# Patient Record
Sex: Female | Born: 1955
Health system: Southern US, Community
[De-identification: ages and names within clinical notes are randomized; demographics above are authoritative.]

## PROBLEM LIST (undated history)

## (undated) DIAGNOSIS — R1900 Intra-abdominal and pelvic swelling, mass and lump, unspecified site: Secondary | ICD-10-CM

## (undated) DIAGNOSIS — C801 Malignant (primary) neoplasm, unspecified: Secondary | ICD-10-CM

## (undated) DIAGNOSIS — N952 Postmenopausal atrophic vaginitis: Secondary | ICD-10-CM

## (undated) DIAGNOSIS — R112 Nausea with vomiting, unspecified: Secondary | ICD-10-CM

## (undated) DIAGNOSIS — N950 Postmenopausal bleeding: Secondary | ICD-10-CM

## (undated) DIAGNOSIS — Z800 Family history of malignant neoplasm of digestive organs: Secondary | ICD-10-CM

## (undated) DIAGNOSIS — Z9889 Other specified postprocedural states: Secondary | ICD-10-CM

## (undated) DIAGNOSIS — R928 Other abnormal and inconclusive findings on diagnostic imaging of breast: Secondary | ICD-10-CM

## (undated) DIAGNOSIS — I10 Essential (primary) hypertension: Secondary | ICD-10-CM

## (undated) DIAGNOSIS — R319 Hematuria, unspecified: Secondary | ICD-10-CM

## (undated) HISTORY — DX: Hematuria, unspecified: R31.9

## (undated) HISTORY — DX: Postmenopausal bleeding: N95.0

## (undated) HISTORY — DX: Essential (primary) hypertension: I10

## (undated) HISTORY — DX: Hypercalcemia: E83.52

## (undated) HISTORY — DX: Family history of malignant neoplasm of digestive organs: Z80.0

## (undated) HISTORY — DX: Intra-abdominal and pelvic swelling, mass and lump, unspecified site: R19.00

## (undated) HISTORY — DX: Other abnormal and inconclusive findings on diagnostic imaging of breast: R92.8

---

## 2006-05-20 ENCOUNTER — Ambulatory Visit: Payer: Self-pay | Admitting: Unknown Physician Specialty

## 2008-12-05 DIAGNOSIS — D239 Other benign neoplasm of skin, unspecified: Secondary | ICD-10-CM

## 2008-12-05 HISTORY — DX: Other benign neoplasm of skin, unspecified: D23.9

## 2009-10-07 DIAGNOSIS — C4491 Basal cell carcinoma of skin, unspecified: Secondary | ICD-10-CM

## 2009-10-07 HISTORY — DX: Basal cell carcinoma of skin, unspecified: C44.91

## 2011-06-04 ENCOUNTER — Ambulatory Visit: Payer: Self-pay | Admitting: Nephrology

## 2011-08-05 LAB — HM COLONOSCOPY

## 2013-04-06 LAB — HM PAP SMEAR

## 2014-12-25 ENCOUNTER — Other Ambulatory Visit: Payer: Self-pay | Admitting: Family Medicine

## 2014-12-25 DIAGNOSIS — R928 Other abnormal and inconclusive findings on diagnostic imaging of breast: Secondary | ICD-10-CM

## 2015-01-10 ENCOUNTER — Ambulatory Visit: Admission: RE | Admit: 2015-01-10 | Payer: Self-pay | Source: Ambulatory Visit

## 2015-01-10 ENCOUNTER — Other Ambulatory Visit: Payer: Self-pay | Admitting: Family Medicine

## 2015-01-10 ENCOUNTER — Ambulatory Visit
Admission: RE | Admit: 2015-01-10 | Discharge: 2015-01-10 | Disposition: A | Discharge status: Home / Self Care | Payer: BLUE CROSS/BLUE SHIELD | Source: Ambulatory Visit | Attending: Family Medicine | Admitting: Family Medicine

## 2015-01-10 DIAGNOSIS — R928 Other abnormal and inconclusive findings on diagnostic imaging of breast: Secondary | ICD-10-CM

## 2015-01-10 HISTORY — DX: Malignant (primary) neoplasm, unspecified: C80.1

## 2015-01-10 LAB — HM MAMMOGRAPHY

## 2015-05-09 DIAGNOSIS — I10 Essential (primary) hypertension: Secondary | ICD-10-CM | POA: Insufficient documentation

## 2015-05-09 DIAGNOSIS — R928 Other abnormal and inconclusive findings on diagnostic imaging of breast: Secondary | ICD-10-CM | POA: Insufficient documentation

## 2015-05-09 DIAGNOSIS — Z800 Family history of malignant neoplasm of digestive organs: Secondary | ICD-10-CM | POA: Insufficient documentation

## 2015-05-15 ENCOUNTER — Ambulatory Visit (INDEPENDENT_AMBULATORY_CARE_PROVIDER_SITE_OTHER): Payer: BLUE CROSS/BLUE SHIELD | Admitting: Family Medicine

## 2015-05-15 ENCOUNTER — Encounter: Payer: Self-pay | Admitting: Family Medicine

## 2015-05-15 VITALS — BP 136/70 | HR 88 | Temp 98.0°F | Ht 61.5 in | Wt 122.0 lb

## 2015-05-15 DIAGNOSIS — R319 Hematuria, unspecified: Secondary | ICD-10-CM | POA: Diagnosis not present

## 2015-05-15 DIAGNOSIS — R1900 Intra-abdominal and pelvic swelling, mass and lump, unspecified site: Secondary | ICD-10-CM

## 2015-05-15 DIAGNOSIS — N882 Stricture and stenosis of cervix uteri: Secondary | ICD-10-CM

## 2015-05-15 DIAGNOSIS — N898 Other specified noninflammatory disorders of vagina: Secondary | ICD-10-CM

## 2015-05-15 DIAGNOSIS — N952 Postmenopausal atrophic vaginitis: Secondary | ICD-10-CM | POA: Diagnosis not present

## 2015-05-15 DIAGNOSIS — N950 Postmenopausal bleeding: Secondary | ICD-10-CM | POA: Diagnosis not present

## 2015-05-15 DIAGNOSIS — Z124 Encounter for screening for malignant neoplasm of cervix: Secondary | ICD-10-CM | POA: Diagnosis not present

## 2015-05-15 DIAGNOSIS — Z86010 Personal history of colonic polyps: Secondary | ICD-10-CM

## 2015-05-15 HISTORY — DX: Hematuria, unspecified: R31.9

## 2015-05-15 HISTORY — DX: Postmenopausal bleeding: N95.0

## 2015-05-15 HISTORY — DX: Intra-abdominal and pelvic swelling, mass and lump, unspecified site: R19.00

## 2015-05-15 LAB — WET PREP FOR TRICH, YEAST, CLUE
CLUE CELL EXAM: NEGATIVE
Trichomonas Exam: NEGATIVE
YEAST EXAM: NEGATIVE

## 2015-05-15 LAB — MICROSCOPIC EXAMINATION

## 2015-05-15 NOTE — Assessment & Plan Note (Signed)
Urine today showed trace intact blood on dip, but 0-2 RBCs/hpf on micro; urine culture pending; may have had UTI and her own immune system may have been dealing with this

## 2015-05-15 NOTE — Patient Instructions (Addendum)
Please do see Dr. Vira Agar as soon as possible to get that colonoscopy scheduled; let Dr. Vira Agar know about your symptoms We'll get an ultrasound of your pelvis We're waiting on the urine culture to decide on antibiotics We can start the estrogen cream for atrophic vaginitis after we get the ultrasound back In the meantime, use lubricating jelly Do keep me posted on any symptoms, especially if not resolving

## 2015-05-15 NOTE — Progress Notes (Signed)
BP 136/70 mmHg  Pulse 88  Temp(Src) 98 F (36.7 C)  Ht 5' 1.5" (1.562 m)  Wt 122 lb (55.339 kg)  BMI 22.68 kg/m2  SpO2 100%   Subjective:    Patient ID: Robin Charles, female    DOB: 07/14/1956, 59 y.o.   MRN: [id number]  HPI: Robin Charles is a 59 y.o. female  Chief Complaint  Patient presents with  . vaginal symptoms    patient states that she has been in pain for the last two weeks, she states she had vaginal discharge and bleeding, tender lymph nodes. Also states that she has had rectal bleeding   She got to where it was uncomfortable having intercourse; then the lymph nodes were sore, water was tender on her skin in the shower; lots of soreness down there; exudate; little bit of blood when wiping after urinating; little bit of redness after bowel movements too; that part is getting better; exudate, wearing a pad for a little bit; not like cottage cheese, not like cottage cheese; more like having a period; LMP was about two years or more; no foul odor down below, just smell of blood like a period; no burning with urination, just tenderness; no trouble urinating; always with either constipation or diarrhea, back and forth; due for colonoscopy; last was 3 years ago; had polyps and was supposed to come back for f/u; mother had colon cancer; Dr. Vira Agar; I asked about red flags like night sweats, just a little hot flash but not any sweats; no weight loss  Relevant past medical, surgical, family and social history reviewed and updated as indicated. Interim medical history since our last visit reviewed. Allergies and medications reviewed and updated.  Review of Systems  Constitutional: Negative for fever, chills and unexpected weight change.  Gastrointestinal: Negative for blood in stool (just with wiping).  Hematological: Bruises/bleeds easily (nothing new, nothing major, likely from minor trauma).  Per HPI unless specifically indicated above     Objective:    BP 136/70  mmHg  Pulse 88  Temp(Src) 98 F (36.7 C)  Ht 5' 1.5" (1.562 m)  Wt 122 lb (55.339 kg)  BMI 22.68 kg/m2  SpO2 100%  Wt Readings from Last 3 Encounters:  05/15/15 122 lb (55.339 kg)    Physical Exam  Constitutional: She appears well-developed and well-nourished.  HENT:  Mouth/Throat: Mucous membranes are normal.  Eyes: EOM are normal. No scleral icterus.  Cardiovascular: Normal rate and regular rhythm.   Pulmonary/Chest: Effort normal and breath sounds normal.  Abdominal: Soft. Normal appearance. There is no tenderness. Hernia confirmed negative in the right inguinal area and confirmed negative in the left inguinal area.  Genitourinary: Rectal exam shows no external hemorrhoid, no fissure, no mass, no tenderness and anal tone normal. Pelvic exam was performed with patient supine. There is no rash or lesion on the right labia. There is no rash or lesion on the left labia. Uterus is not deviated, not enlarged and not tender. Cervix exhibits no motion tenderness, no discharge and no friability. Right adnexum displays no mass, no tenderness and no fullness. Left adnexum displays mass and fullness. Left adnexum displays no tenderness. No erythema, tenderness or bleeding in the vagina. No signs of injury around the vagina. No vaginal discharge found.  There is mild erythema of the urethra without discharge; there is discrete fullness/mass on the left, about 2 cm diameter; nontender; os is stenotic  Lymphadenopathy:       Right: No inguinal adenopathy present.  Left: No inguinal adenopathy present.  Psychiatric: She has a normal mood and affect. Her behavior is normal.   Results for orders placed or performed in visit on 05/09/15  HM MAMMOGRAPHY  Result Value Ref Range   HM Mammogram per PP   HM PAP SMEAR  Result Value Ref Range   HM Pap smear per PP   HM COLONOSCOPY  Result Value Ref Range   HM Colonoscopy per PP, repeat in 3 years       Assessment & Plan:   Problem List Items  Addressed This Visit      Genitourinary   Stenotic cervical os    Noted in GYN note previously; explained dx/finding to patient      Relevant Orders   Pap liquid-based and HPV (high risk)   Postmenopausal atrophic vaginitis    Use lubricating jelly; will consider estrogen but after current work-up, needs [place] first        Other   Vaginal discharge    Wet mount was negative today, discussed with patient      Relevant Orders   WET PREP FOR [place], [place], CLUE   History of colon polyps    Patient to contact Dr. Vira Agar and schedule colonoscopy      Postmenopausal bleeding    Never normal, so will get pelvic US; she has seen GYN in the past, refer back after [place]      Relevant Orders   US Pelvis Complete   Pap liquid-based and HPV (high risk)   Pelvic mass in female    She has fullness on the left previously and was sent to GYN; US done at GYN office was negative for mass; will get pelvic US as I feel discrete mass on the left; ddx includes fibroid, ovarian or adnexal mass      Relevant Orders   US Pelvis Complete   Cervical cancer screening    Thin prep collected today; cervical os was stenotic so doubt endocervical cells will be present      Relevant Orders   Pap liquid-based and HPV (high risk)   Hematuria - Primary    Urine today showed trace intact blood on dip, but 0-2 RBCs/hpf on micro; urine culture pending; may have had UTI and her own immune system may have been dealing with this      Relevant Orders   UA/M w/rflx Culture, Routine      Follow up plan: Return if symptoms worsen or fail to improve.  Orders Placed This Encounter  Procedures  . WET PREP FOR [place], [place], CLUE  . US Pelvis Complete  . UA/M w/rflx Culture, Routine

## 2015-05-15 NOTE — Assessment & Plan Note (Signed)
She has fullness on the left previously and was sent to GYN; US done at GYN office was negative for mass; will get pelvic US as I feel discrete mass on the left; ddx includes fibroid, ovarian or adnexal mass

## 2015-05-15 NOTE — Assessment & Plan Note (Signed)
Wet mount was negative today, discussed with patient

## 2015-05-15 NOTE — Assessment & Plan Note (Signed)
Patient to contact Dr. Vira Agar and schedule colonoscopy

## 2015-05-15 NOTE — Assessment & Plan Note (Signed)
Thin prep collected today; cervical os was stenotic so doubt endocervical cells will be present

## 2015-05-15 NOTE — Assessment & Plan Note (Signed)
Never normal, so will get pelvic US; she has seen GYN in the past, refer back after [place]

## 2015-05-15 NOTE — Assessment & Plan Note (Addendum)
Use lubricating jelly; will consider estrogen but after current work-up, needs [place] first

## 2015-05-15 NOTE — Assessment & Plan Note (Signed)
Noted in GYN note previously; explained dx/finding to patient

## 2015-05-17 ENCOUNTER — Telehealth: Payer: Self-pay | Admitting: Family Medicine

## 2015-05-17 LAB — UA/M W/RFLX CULTURE, ROUTINE

## 2015-05-17 NOTE — Telephone Encounter (Signed)
I called and let her know urine culture did not grow out any significant infection

## 2015-05-21 ENCOUNTER — Telehealth: Payer: Self-pay | Admitting: Family Medicine

## 2015-05-21 LAB — PAP LB AND HPV HIGH-RISK: PAP SMEAR COMMENT: 0

## 2015-05-21 NOTE — Telephone Encounter (Signed)
Please let patient know that her pap smear was normal Please check on the status of the ultrasound that was ordered

## 2015-05-22 ENCOUNTER — Other Ambulatory Visit: Payer: Self-pay | Admitting: Family Medicine

## 2015-05-22 DIAGNOSIS — R1900 Intra-abdominal and pelvic swelling, mass and lump, unspecified site: Secondary | ICD-10-CM

## 2015-05-22 DIAGNOSIS — N950 Postmenopausal bleeding: Secondary | ICD-10-CM

## 2015-05-22 NOTE — Telephone Encounter (Signed)
Message left that PAP normal, Robin Charles took care of [place] and patient is aware of appointment

## 2015-05-26 ENCOUNTER — Telehealth: Payer: Self-pay | Admitting: Family Medicine

## 2015-05-26 DIAGNOSIS — R1900 Intra-abdominal and pelvic swelling, mass and lump, unspecified site: Secondary | ICD-10-CM

## 2015-05-26 NOTE — Telephone Encounter (Signed)
I thought I entered this order, first on Sept 8th and then it looks like another ordered on Sept 15th The signature should count; I will re-enter the [place] again; when I did, it told me this was a duplicate order I am out of the office today and will be glad to sign whatever is needed, but don't know why my original orders don't work Please follow-up and let me know if there is something else I have to do to make this [place] happen

## 2015-05-26 NOTE — Assessment & Plan Note (Signed)
Re-enter [place]

## 2015-05-26 NOTE — Telephone Encounter (Signed)
Cassandra from [facility] Radiology scheduling called stated she needs an e-signature on the order for the trans-vag ultra sound. Thanks.

## 2015-05-27 ENCOUNTER — Ambulatory Visit: Admission: RE | Admit: 2015-05-27 | Payer: BLUE CROSS/BLUE SHIELD | Source: Ambulatory Visit

## 2015-05-30 ENCOUNTER — Ambulatory Visit
Admission: RE | Admit: 2015-05-30 | Discharge: 2015-05-30 | Disposition: A | Discharge status: Home / Self Care | Payer: BLUE CROSS/BLUE SHIELD | Source: Ambulatory Visit | Attending: Family Medicine | Admitting: Family Medicine

## 2015-05-30 DIAGNOSIS — N950 Postmenopausal bleeding: Secondary | ICD-10-CM | POA: Insufficient documentation

## 2015-05-30 DIAGNOSIS — R1900 Intra-abdominal and pelvic swelling, mass and lump, unspecified site: Secondary | ICD-10-CM

## 2015-06-04 ENCOUNTER — Telehealth: Payer: Self-pay | Admitting: Family Medicine

## 2015-06-04 NOTE — Telephone Encounter (Signed)
Left message to call.

## 2015-06-04 NOTE — Telephone Encounter (Signed)
Please let patient know that the [place] was unremarkable; it was limited, though, by bowel gas I think we should proceed with having her see GYN (she can call and schedule appt since she is established there, I believe); make sure they have access to my note and labs and [place] She should also see Dr. Vira Agar I want to be thorough and have BOTH GYN and GI see her

## 2015-06-05 NOTE — Telephone Encounter (Signed)
Patient notified, she said she will get those appointments scheduled and knows she needs to get a colonoscopy.

## 2015-09-24 ENCOUNTER — Encounter: Payer: Self-pay | Admitting: *Deleted

## 2015-09-25 ENCOUNTER — Ambulatory Visit
Admission: RE | Admit: 2015-09-25 | Discharge: 2015-09-25 | Disposition: A | Discharge status: Home / Self Care | Payer: BLUE CROSS/BLUE SHIELD | Source: Ambulatory Visit | Attending: Unknown Physician Specialty | Admitting: Unknown Physician Specialty

## 2015-09-25 ENCOUNTER — Encounter
Admission: RE | Disposition: A | Discharge status: Home / Self Care | Payer: Self-pay | Source: Ambulatory Visit | Attending: Unknown Physician Specialty

## 2015-09-25 ENCOUNTER — Ambulatory Visit: Payer: BLUE CROSS/BLUE SHIELD | Admitting: Anesthesiology

## 2015-09-25 DIAGNOSIS — Z800 Family history of malignant neoplasm of digestive organs: Secondary | ICD-10-CM | POA: Insufficient documentation

## 2015-09-25 DIAGNOSIS — K621 Rectal polyp: Secondary | ICD-10-CM | POA: Insufficient documentation

## 2015-09-25 DIAGNOSIS — I10 Essential (primary) hypertension: Secondary | ICD-10-CM | POA: Insufficient documentation

## 2015-09-25 DIAGNOSIS — Z85828 Personal history of other malignant neoplasm of skin: Secondary | ICD-10-CM | POA: Diagnosis not present

## 2015-09-25 DIAGNOSIS — Z79899 Other long term (current) drug therapy: Secondary | ICD-10-CM | POA: Insufficient documentation

## 2015-09-25 DIAGNOSIS — Z7982 Long term (current) use of aspirin: Secondary | ICD-10-CM | POA: Insufficient documentation

## 2015-09-25 DIAGNOSIS — Z86010 Personal history of colonic polyps: Secondary | ICD-10-CM | POA: Diagnosis present

## 2015-09-25 HISTORY — DX: Postmenopausal atrophic vaginitis: N95.2

## 2015-09-25 HISTORY — PX: COLONOSCOPY WITH PROPOFOL: SHX5780

## 2015-09-25 SURGERY — COLONOSCOPY WITH PROPOFOL
Anesthesia: General

## 2015-09-25 MED ORDER — PROPOFOL 500 MG/50ML IV EMUL
INTRAVENOUS | Status: DC | PRN
  Administered 2015-09-25: 120 ug/kg/min via INTRAVENOUS

## 2015-09-25 MED ORDER — MIDAZOLAM HCL 2 MG/2ML IJ SOLN
INTRAMUSCULAR | Status: DC | PRN
  Administered 2015-09-25: 1 mg via INTRAVENOUS

## 2015-09-25 MED ORDER — SODIUM CHLORIDE 0.9 % IV SOLN: INTRAVENOUS | Status: DC

## 2015-09-25 MED ORDER — LIDOCAINE HCL (CARDIAC) 20 MG/ML IV SOLN
INTRAVENOUS | Status: DC | PRN
  Administered 2015-09-25: 60 mg via INTRAVENOUS

## 2015-09-25 MED ORDER — SODIUM CHLORIDE 0.9 % IV SOLN
INTRAVENOUS | Status: DC
  Administered 2015-09-25: 14:00:00 via INTRAVENOUS

## 2015-09-25 MED ORDER — PROPOFOL 10 MG/ML IV BOLUS
INTRAVENOUS | Status: DC | PRN
  Administered 2015-09-25: 30 mg via INTRAVENOUS
  Administered 2015-09-25: 20 mg via INTRAVENOUS

## 2015-09-25 NOTE — H&P (Signed)
   Primary Care Physician:  Enid Derry, MD Primary Gastroenterologist:  Dr. Vira Agar  Pre-Procedure History & Physical: HPI:  Robin Charles is a 60 y.o. female is here for an colonoscopy.   Past Medical History  Diagnosis Date  . Cancer ([place])     skin ca  . Hypertension   . Hypercalcemia   . Family history of colon cancer in mother   . Abnormal mammogram   . Postmenopausal atrophic vaginitis     No past surgical history on file.  Prior to Admission medications   Medication Sig Start Date End Date Taking? Authorizing Provider  aspirin EC 81 MG tablet Take 81 mg by mouth daily.    Historical Provider, MD  cetirizine (ZYRTEC) 10 MG tablet Take 10 mg by mouth daily.    Historical Provider, MD  Multiple Vitamin (MULTIVITAMIN) tablet Take 1 tablet by mouth daily.    Historical Provider, MD    Allergies as of 09/03/2015  . (No Known Allergies)    Family History  Problem Relation Age of Onset  . Colon cancer Mother 70  . Cancer Mother     Social History   Social History  . Marital Status: Married    Spouse Name: N/A  . Number of Children: N/A  . Years of Education: N/A   Occupational History  . Not on file.   Social History Main Topics  . Smoking status: Never Smoker   . Smokeless tobacco: Never Used  . Alcohol Use: No  . Drug Use: No  . Sexual Activity: Yes    Birth Control/ Protection: Post-menopausal   Other Topics Concern  . Not on file   Social History Narrative    Review of Systems: See HPI, otherwise negative ROS  Physical Exam: BP 145/79 mmHg  Pulse 79  Temp(Src) 97 F (36.1 C) (Tympanic)  Ht 5\' 1"  (1.549 m)  Wt 53.071 kg (117 lb)  BMI 22.12 kg/m2  SpO2 100% General:   Alert,  pleasant and cooperative in NAD Head:  Normocephalic and atraumatic. Neck:  Supple; no masses or thyromegaly. Lungs:  Clear throughout to auscultation.    Heart:  Regular rate and rhythm. Abdomen:  Soft, nontender and nondistended. Normal bowel sounds, without  guarding, and without rebound.   Neurologic:  Alert and  oriented x4;  grossly normal neurologically.  Impression/Plan: [name] is here for an colonoscopy to be performed for polyp in distal rectum.  Risks, benefits, limitations, and alternatives regarding  colonoscopy have been reviewed with the patient.  Questions have been answered.  All parties agreeable.   Gaylyn Cheers, MD  09/25/2015, 2:28 PM

## 2015-09-25 NOTE — Anesthesia Postprocedure Evaluation (Signed)
Anesthesia Post Note  Patient: Robin Charles  Procedure(s) Performed: Procedure(s) (LRB): COLONOSCOPY WITH PROPOFOL (N/A)  Patient location during evaluation: Endoscopy Anesthesia Type: General Level of consciousness: awake and alert Pain management: pain level controlled Vital Signs Assessment: post-procedure vital signs reviewed and stable Respiratory status: spontaneous breathing and respiratory function stable Cardiovascular status: stable Anesthetic complications: no    Last Vitals:  Filed Vitals:   09/25/15 1510 09/25/15 1520  BP: 135/82 122/74  Pulse: 76 82  Temp:    Resp: 24 18    Last Pain: There were no vitals filed for this visit.               Adrean Heitz K

## 2015-09-25 NOTE — Anesthesia Preprocedure Evaluation (Addendum)
Anesthesia Evaluation  Patient identified by MRN, date of birth, ID band Patient awake    Reviewed: Allergy & Precautions, NPO status , Patient's Chart, lab work & pertinent test results  History of Anesthesia Complications (+) PONV  Airway Mallampati: II       Dental   Pulmonary neg pulmonary ROS,           Cardiovascular hypertension (OK after d/c bcp),      Neuro/Psych negative neurological ROS     GI/Hepatic negative GI ROS, Neg liver ROS,   Endo/Other  negative endocrine ROS  Renal/GU negative Renal ROS     Musculoskeletal   Abdominal   Peds  Hematology negative hematology ROS (+)   Anesthesia Other Findings   Reproductive/Obstetrics                            Anesthesia Physical Anesthesia Plan  ASA: II  Anesthesia Plan: General   Post-op Pain Management:    Induction: Intravenous  Airway Management Planned: Nasal Cannula  Additional Equipment:   Intra-op Plan:   Post-operative Plan:   Informed Consent: I have reviewed the patients History and Physical, chart, labs and discussed the procedure including the risks, benefits and alternatives for the proposed anesthesia with the patient or authorized representative who has indicated his/her understanding and acceptance.     Plan Discussed with:   Anesthesia Plan Comments:         Anesthesia Quick Evaluation

## 2015-09-25 NOTE — Transfer of Care (Signed)
Immediate Anesthesia Transfer of Care Note  Patient: Robin Charles  Procedure(s) Performed: Procedure(s): COLONOSCOPY WITH PROPOFOL (N/A)  Patient Location: PACU  Anesthesia Type:General  Level of Consciousness: awake and alert   Airway & Oxygen Therapy: Patient Spontanous Breathing and Patient connected to nasal cannula oxygen  Post-op Assessment: Report given to RN and Post -op Vital signs reviewed and stable  Post vital signs: Reviewed and stable  Last Vitals:  Filed Vitals:   09/25/15 1407 09/25/15 1458  BP: 145/79 121/73  Pulse: 79 69  Temp: 36.1 C 36.2 C  Resp:  12    Complications: No apparent anesthesia complications

## 2015-09-25 NOTE — Op Note (Addendum)
[facility] Gastroenterology Patient Name: Robin Charles Procedure Date: 09/25/2015 2:29 PM MRN: [id number] Account #: [id number] Date of Birth: 15-Oct-1955 Admit Type: Outpatient Age: 60 Room: [facility] ENDO ROOM 1 Gender: Female Note Status: Finalized Procedure:         Flexible Sigmoidoscopy Indications:       Follow-up for history of adenomatous polyps in the colon Providers:         Manya Silvas, MD Referring MD:      Arnetha Courser (Referring MD) Medicines:         Propofol per Anesthesia, Flex sig done, not a colonoscopy                     this time. Complications:     No immediate complications. Procedure:         Pre-Anesthesia Assessment:                    - After reviewing the risks and benefits, the patient was                     deemed in satisfactory condition to undergo the procedure.                    After obtaining informed consent, the scope was passed                     under direct vision. The Colonoscope was introduced                     through the anus and advanced to the the rectum. The                     colonoscopy was performed without difficulty. The patient                     tolerated the procedure well. The quality of the bowel                     preparation was excellent. After obtaining informed                     consent, the scope was passed under direct vision. Findings:      A medium-large polyp was found in the rectum. The polyp was sessile. It       was very flat. Narrow band imaging showed it well. Dr. Tamala Julian came to see       the growth. This would be very difficult to remove endoscopicly. It       appears to be in left side of rectum. Impression:        - One large polyp in the rectum.                    - No specimens collected. Recommendation:    - The findings and recommendations were discussed with the                     surgeon. Patient to see Dr. Tamala Julian. Manya Silvas, MD 09/25/2015 3:00:50 PM This  report has been signed electronically. Number of Addenda: 0 Note Initiated On: 09/25/2015 2:29 PM Total Procedure Duration: 0 hours 20 minutes 55 seconds       [facility]

## 2015-09-25 NOTE — Anesthesia Procedure Notes (Signed)
Date/Time: 09/25/2015 2:32 PM Performed by: Johnna Acosta Pre-anesthesia Checklist: Patient identified, Emergency Drugs available, Suction available, Patient being monitored and Timeout performed Patient Re-evaluated:Patient Re-evaluated prior to inductionOxygen Delivery Method: Nasal cannula

## 2015-09-29 ENCOUNTER — Encounter: Payer: Self-pay | Admitting: Unknown Physician Specialty

## 2015-11-05 HISTORY — PX: RECTAL POLYPECTOMY: SHX2309

## 2015-12-09 DIAGNOSIS — D128 Benign neoplasm of rectum: Secondary | ICD-10-CM | POA: Diagnosis not present

## 2015-12-09 DIAGNOSIS — Z86018 Personal history of other benign neoplasm: Secondary | ICD-10-CM | POA: Diagnosis not present

## 2015-12-09 DIAGNOSIS — Z48815 Encounter for surgical aftercare following surgery on the digestive system: Secondary | ICD-10-CM | POA: Diagnosis not present

## 2016-06-14 DIAGNOSIS — Z23 Encounter for immunization: Secondary | ICD-10-CM | POA: Diagnosis not present

## 2016-10-28 ENCOUNTER — Encounter: Payer: BLUE CROSS/BLUE SHIELD | Admitting: Family Medicine

## 2016-11-12 ENCOUNTER — Ambulatory Visit (INDEPENDENT_AMBULATORY_CARE_PROVIDER_SITE_OTHER): Payer: BLUE CROSS/BLUE SHIELD | Admitting: Family Medicine

## 2016-11-12 ENCOUNTER — Encounter: Payer: Self-pay | Admitting: Family Medicine

## 2016-11-12 VITALS — BP 128/72 | HR 61 | Temp 97.8°F | Resp 17 | Ht 62.0 in | Wt 122.0 lb

## 2016-11-12 DIAGNOSIS — Z0000 Encounter for general adult medical examination without abnormal findings: Secondary | ICD-10-CM | POA: Diagnosis not present

## 2016-11-12 DIAGNOSIS — I10 Essential (primary) hypertension: Secondary | ICD-10-CM | POA: Diagnosis not present

## 2016-11-12 DIAGNOSIS — Z114 Encounter for screening for human immunodeficiency virus [HIV]: Secondary | ICD-10-CM | POA: Diagnosis not present

## 2016-11-12 DIAGNOSIS — Z1159 Encounter for screening for other viral diseases: Secondary | ICD-10-CM | POA: Diagnosis not present

## 2016-11-12 DIAGNOSIS — Z1239 Encounter for other screening for malignant neoplasm of breast: Secondary | ICD-10-CM

## 2016-11-12 DIAGNOSIS — Z1231 Encounter for screening mammogram for malignant neoplasm of breast: Secondary | ICD-10-CM

## 2016-11-12 LAB — MICROSCOPIC EXAMINATION
Bacteria, UA: NONE SEEN
RBC MICROSCOPIC, UA: NONE SEEN /HPF (ref 0–?)

## 2016-11-12 LAB — UA/M W/RFLX CULTURE, ROUTINE
BILIRUBIN UA: NEGATIVE
Glucose, UA: NEGATIVE
Ketones, UA: NEGATIVE
Nitrite, UA: NEGATIVE
PH UA: 7 (ref 5.0–7.5)
Protein, UA: NEGATIVE
Specific Gravity, UA: 1.01 (ref 1.005–1.030)
UUROB: 0.2 mg/dL (ref 0.2–1.0)

## 2016-11-12 LAB — MICROALBUMIN, URINE WAIVED
Creatinine, Urine Waived: 50 mg/dL (ref 10–300)
Microalb, Ur Waived: 10 mg/L (ref 0–19)
Microalb/Creat Ratio: <30 mg/g (ref <30)

## 2016-11-12 NOTE — Patient Instructions (Addendum)
Health Maintenance, Female Adopting a healthy lifestyle and getting preventive care can go a long way to promote health and wellness. Talk with your health care provider about what schedule of regular examinations is right for you. This is a good chance for you to check in with your provider about disease prevention and staying healthy. In between checkups, there are plenty of things you can do on your own. Experts have done a lot of research about which lifestyle changes and preventive measures are most likely to keep you healthy. Ask your health care provider for more information. Weight and diet Eat a healthy diet  Be sure to include plenty of vegetables, fruits, low-fat dairy products, and lean protein.  Do not eat a lot of foods high in solid fats, added sugars, or salt.  Get regular exercise. This is one of the most important things you can do for your health.  Most adults should exercise for at least 150 minutes each week. The exercise should increase your heart rate and make you sweat (moderate-intensity exercise).  Most adults should also do strengthening exercises at least twice a week. This is in addition to the moderate-intensity exercise. Maintain a healthy weight  Body mass index (BMI) is a measurement that can be used to identify possible weight problems. It estimates body fat based on height and weight. Your health care provider can help determine your BMI and help you achieve or maintain a healthy weight.  For females 76 years of age and older:  A BMI below 18.5 is considered underweight.  A BMI of 18.5 to 24.9 is normal.  A BMI of 25 to 29.9 is considered overweight.  A BMI of 30 and above is considered obese. Watch levels of cholesterol and blood lipids  You should start having your blood tested for lipids and cholesterol at 60 years of age, then have this test every 5 years.  You may need to have your cholesterol levels checked more often if:  Your lipid or  cholesterol levels are high.  You are older than 61 years of age.  You are at high risk for heart disease. Cancer screening Lung Cancer  Lung cancer screening is recommended for adults 64-42 years old who are at high risk for lung cancer because of a history of smoking.  A yearly low-dose CT scan of the lungs is recommended for people who:  Currently smoke.  Have quit within the past 15 years.  Have at least a 30-pack-year history of smoking. A pack year is smoking an average of one pack of cigarettes a day for 1 year.  Yearly screening should continue until it has been 15 years since you quit.  Yearly screening should stop if you develop a health problem that would prevent you from having lung cancer treatment. Breast Cancer  Practice breast self-awareness. This means understanding how your breasts normally appear and feel.  It also means doing regular breast self-exams. Let your health care provider know about any changes, no matter how small.  If you are in your 20s or 30s, you should have a clinical breast exam (CBE) by a health care provider every 1-3 years as part of a regular health exam.  If you are 34 or older, have a CBE every year. Also consider having a breast X-ray (mammogram) every year.  If you have a family history of breast cancer, talk to your health care provider about genetic screening.  If you are at high risk for breast cancer, talk  to your health care provider about having an MRI and a mammogram every year.  Breast cancer gene (BRCA) assessment is recommended for women who have family members with BRCA-related cancers. BRCA-related cancers include:  Breast.  Ovarian.  Tubal.  Peritoneal cancers.  Results of the assessment will determine the need for genetic counseling and BRCA1 and BRCA2 testing. Cervical Cancer  Your health care provider may recommend that you be screened regularly for cancer of the pelvic organs (ovaries, uterus, and vagina).  This screening involves a pelvic examination, including checking for microscopic changes to the surface of your cervix (Pap test). You may be encouraged to have this screening done every 3 years, beginning at age 24.  For women ages 66-65, health care providers may recommend pelvic exams and Pap testing every 3 years, or they may recommend the Pap and pelvic exam, combined with testing for human papilloma virus (HPV), every 5 years. Some types of HPV increase your risk of cervical cancer. Testing for HPV may also be done on women of any age with unclear Pap test results.  Other health care providers may not recommend any screening for nonpregnant women who are considered low risk for pelvic cancer and who do not have symptoms. Ask your health care provider if a screening pelvic exam is right for you.  If you have had past treatment for cervical cancer or a condition that could lead to cancer, you need Pap tests and screening for cancer for at least 20 years after your treatment. If Pap tests have been discontinued, your risk factors (such as having a new sexual partner) need to be reassessed to determine if screening should resume. Some women have medical problems that increase the chance of getting cervical cancer. In these cases, your health care provider may recommend more frequent screening and Pap tests. Colorectal Cancer  This type of cancer can be detected and often prevented.  Routine colorectal cancer screening usually begins at 61 years of age and continues through 61 years of age.  Your health care provider may recommend screening at an earlier age if you have risk factors for colon cancer.  Your health care provider may also recommend using home test kits to check for hidden blood in the stool.  A small camera at the end of a tube can be used to examine your colon directly (sigmoidoscopy or colonoscopy). This is done to check for the earliest forms of colorectal cancer.  Routine  screening usually begins at age 41.  Direct examination of the colon should be repeated every 5-10 years through 61 years of age. However, you may need to be screened more often if early forms of precancerous polyps or small growths are found. Skin Cancer  Check your skin from head to toe regularly.  Tell your health care provider about any new moles or changes in moles, especially if there is a change in a mole's shape or color.  Also tell your health care provider if you have a mole that is larger than the size of a pencil eraser.  Always use sunscreen. Apply sunscreen liberally and repeatedly throughout the day.  Protect yourself by wearing long sleeves, pants, a wide-brimmed hat, and sunglasses whenever you are outside. Heart disease, diabetes, and high blood pressure  High blood pressure causes heart disease and increases the risk of stroke. High blood pressure is more likely to develop in:  People who have blood pressure in the high end of the normal range (130-139/85-89 mm Hg).  People who are overweight or obese.  People who are African American.  If you are 59-24 years of age, have your blood pressure checked every 3-5 years. If you are 34 years of age or older, have your blood pressure checked every year. You should have your blood pressure measured twice-once when you are at a hospital or clinic, and once when you are not at a hospital or clinic. Record the average of the two measurements. To check your blood pressure when you are not at a hospital or clinic, you can use:  An automated blood pressure machine at a pharmacy.  A home blood pressure monitor.  If you are between 29 years and 60 years old, ask your health care provider if you should take aspirin to prevent strokes.  Have regular diabetes screenings. This involves taking a blood sample to check your fasting blood sugar level.  If you are at a normal weight and have a low risk for diabetes, have this test once  every three years after 61 years of age.  If you are overweight and have a high risk for diabetes, consider being tested at a younger age or more often. Preventing infection Hepatitis B  If you have a higher risk for hepatitis B, you should be screened for this virus. You are considered at high risk for hepatitis B if:  You were born in a country where hepatitis B is common. Ask your health care provider which countries are considered high risk.  Your parents were born in a high-risk country, and you have not been immunized against hepatitis B (hepatitis B vaccine).  You have HIV or AIDS.  You use needles to inject street drugs.  You live with someone who has hepatitis B.  You have had sex with someone who has hepatitis B.  You get hemodialysis treatment.  You take certain medicines for conditions, including cancer, organ transplantation, and autoimmune conditions. Hepatitis C  Blood testing is recommended for:  Everyone born from [birth year] through 1965.  Anyone with known risk factors for hepatitis C. Sexually transmitted infections (STIs)  You should be screened for sexually transmitted infections (STIs) including gonorrhea and chlamydia if:  You are sexually active and are younger than 61 years of age.  You are older than 61 years of age and your health care provider tells you that you are at risk for this type of infection.  Your sexual activity has changed since you were last screened and you are at an increased risk for chlamydia or gonorrhea. Ask your health care provider if you are at risk.  If you do not have HIV, but are at risk, it may be recommended that you take a prescription medicine daily to prevent HIV infection. This is called pre-exposure prophylaxis (PrEP). You are considered at risk if:  You are sexually active and do not regularly use condoms or know the HIV status of your partner(s).  You take drugs by injection.  You are sexually active with a partner  who has HIV. Talk with your health care provider about whether you are at high risk of being infected with HIV. If you choose to begin PrEP, you should first be tested for HIV. You should then be tested every 3 months for as long as you are taking PrEP. Pregnancy  If you are premenopausal and you may become pregnant, ask your health care provider about preconception counseling.  If you may become pregnant, take 400 to 800 micrograms (mcg) of folic acid  every day.  If you want to prevent pregnancy, talk to your health care provider about birth control (contraception). Osteoporosis and menopause  Osteoporosis is a disease in which the bones lose minerals and strength with aging. This can result in serious bone fractures. Your risk for osteoporosis can be identified using a bone density scan.  If you are 63 years of age or older, or if you are at risk for osteoporosis and fractures, ask your health care provider if you should be screened.  Ask your health care provider whether you should take a calcium or vitamin D supplement to lower your risk for osteoporosis.  Menopause may have certain physical symptoms and risks.  Hormone replacement therapy may reduce some of these symptoms and risks. Talk to your health care provider about whether hormone replacement therapy is right for you. Follow these instructions at home:  Schedule regular health, dental, and eye exams.  Stay current with your immunizations.  Do not use any tobacco products including cigarettes, chewing tobacco, or electronic cigarettes.  If you are pregnant, do not drink alcohol.  If you are breastfeeding, limit how much and how often you drink alcohol.  Limit alcohol intake to no more than 1 drink per day for nonpregnant women. One drink equals 12 ounces of beer, 5 ounces of wine, or 1 ounces of hard liquor.  Do not use street drugs.  Do not share needles.  Ask your health care provider for help if you need support  or information about quitting drugs.  Tell your health care provider if you often feel depressed.  Tell your health care provider if you have ever been abused or do not feel safe at home. This information is not intended to replace advice given to you by your health care provider. Make sure you discuss any questions you have with your health care provider. Document Released: 03/08/2011 Document Revised: 01/29/2016 Document Reviewed: 05/27/2015 Elsevier Interactive Patient Education  2017 [place] Maintenance for Postmenopausal Women Menopause is a normal process in which your reproductive ability comes to an end. This process happens gradually over a span of months to years, usually between the ages of 48 and 29. Menopause is complete when you have missed 12 consecutive menstrual periods. It is important to talk with your health care provider about some of the most common conditions that affect postmenopausal women, such as heart disease, cancer, and bone loss (osteoporosis). Adopting a healthy lifestyle and getting preventive care can help to promote your health and wellness. Those actions can also lower your chances of developing some of these common conditions. What should I know about menopause? During menopause, you may experience a number of symptoms, such as:  Moderate-to-severe hot flashes.  Night sweats.  Decrease in sex drive.  Mood swings.  Headaches.  Tiredness.  Irritability.  Memory problems.  Insomnia. Choosing to treat or not to treat menopausal changes is an individual decision that you make with your health care provider. What should I know about hormone replacement therapy and supplements? Hormone therapy products are effective for treating symptoms that are associated with menopause, such as hot flashes and night sweats. Hormone replacement carries certain risks, especially as you become older. If you are thinking about using estrogen or estrogen  with progestin treatments, discuss the benefits and risks with your health care provider. What should I know about heart disease and stroke? Heart disease, heart attack, and stroke become more likely as you age. This may be due, in  part, to the hormonal changes that your body experiences during menopause. These can affect how your body processes dietary fats, triglycerides, and cholesterol. Heart attack and stroke are both medical emergencies. There are many things that you can do to help prevent heart disease and stroke:  Have your blood pressure checked at least every 1-2 years. High blood pressure causes heart disease and increases the risk of stroke.  If you are 54-61 years old, ask your health care provider if you should take aspirin to prevent a heart attack or a stroke.  Do not use any tobacco products, including cigarettes, chewing tobacco, or electronic cigarettes. If you need help quitting, ask your health care provider.  It is important to eat a healthy diet and maintain a healthy weight.  Be sure to include plenty of vegetables, fruits, low-fat dairy products, and lean protein.  Avoid eating foods that are high in solid fats, added sugars, or salt (sodium).  Get regular exercise. This is one of the most important things that you can do for your health.  Try to exercise for at least 150 minutes each week. The type of exercise that you do should increase your heart rate and make you sweat. This is known as moderate-intensity exercise.  Try to do strengthening exercises at least twice each week. Do these in addition to the moderate-intensity exercise.  Know your numbers.Ask your health care provider to check your cholesterol and your blood glucose. Continue to have your blood tested as directed by your health care provider. What should I know about cancer screening? There are several types of cancer. Take the following steps to reduce your risk and to catch any cancer development  as early as possible. Breast Cancer  Practice breast self-awareness.  This means understanding how your breasts normally appear and feel.  It also means doing regular breast self-exams. Let your health care provider know about any changes, no matter how small.  If you are 63 or older, have a clinician do a breast exam (clinical breast exam or CBE) every year. Depending on your age, family history, and medical history, it may be recommended that you also have a yearly breast X-ray (mammogram).  If you have a family history of breast cancer, talk with your health care provider about genetic screening.  If you are at high risk for breast cancer, talk with your health care provider about having an MRI and a mammogram every year.  Breast cancer (BRCA) gene test is recommended for women who have family members with BRCA-related cancers. Results of the assessment will determine the need for genetic counseling and BRCA1 and for BRCA2 testing. BRCA-related cancers include these types:  Breast. This occurs in males or females.  Ovarian.  Tubal. This may also be called fallopian tube cancer.  Cancer of the abdominal or pelvic lining (peritoneal cancer).  Prostate.  Pancreatic. Cervical, Uterine, and Ovarian Cancer  Your health care provider may recommend that you be screened regularly for cancer of the pelvic organs. These include your ovaries, uterus, and vagina. This screening involves a pelvic exam, which includes checking for microscopic changes to the surface of your cervix (Pap test).  For women ages 21-65, health care providers may recommend a pelvic exam and a Pap test every three years. For women ages 61-65, they may recommend the Pap test and pelvic exam, combined with testing for human papilloma virus (HPV), every five years. Some types of HPV increase your risk of cervical cancer. Testing for HPV may  also be done on women of any age who have unclear Pap test results.  Other health  care providers may not recommend any screening for nonpregnant women who are considered low risk for pelvic cancer and have no symptoms. Ask your health care provider if a screening pelvic exam is right for you.  If you have had past treatment for cervical cancer or a condition that could lead to cancer, you need Pap tests and screening for cancer for at least 20 years after your treatment. If Pap tests have been discontinued for you, your risk factors (such as having a new sexual partner) need to be reassessed to determine if you should start having screenings again. Some women have medical problems that increase the chance of getting cervical cancer. In these cases, your health care provider may recommend that you have screening and Pap tests more often.  If you have a family history of uterine cancer or ovarian cancer, talk with your health care provider about genetic screening.  If you have vaginal bleeding after reaching menopause, tell your health care provider.  There are currently no reliable tests available to screen for ovarian cancer. Lung Cancer  Lung cancer screening is recommended for adults 61-47 years old who are at high risk for lung cancer because of a history of smoking. A yearly low-dose CT scan of the lungs is recommended if you:  Currently smoke.  Have a history of at least 30 pack-years of smoking and you currently smoke or have quit within the past 15 years. A pack-year is smoking an average of one pack of cigarettes per day for one year. Yearly screening should:  Continue until it has been 15 years since you quit.  Stop if you develop a health problem that would prevent you from having lung cancer treatment. Colorectal Cancer  This type of cancer can be detected and can often be prevented.  Routine colorectal cancer screening usually begins at age 2 and continues through age 50.  If you have risk factors for colon cancer, your health care provider may recommend  that you be screened at an earlier age.  If you have a family history of colorectal cancer, talk with your health care provider about genetic screening.  Your health care provider may also recommend using home test kits to check for hidden blood in your stool.  A small camera at the end of a tube can be used to examine your colon directly (sigmoidoscopy or colonoscopy). This is done to check for the earliest forms of colorectal cancer.  Direct examination of the colon should be repeated every 5-10 years until age 54. However, if early forms of precancerous polyps or small growths are found or if you have a family history or genetic risk for colorectal cancer, you may need to be screened more often. Skin Cancer  Check your skin from head to toe regularly.  Monitor any moles. Be sure to tell your health care provider:  About any new moles or changes in moles, especially if there is a change in a mole's shape or color.  If you have a mole that is larger than the size of a pencil eraser.  If any of your family members has a history of skin cancer, especially at a young age, talk with your health care provider about genetic screening.  Always use sunscreen. Apply sunscreen liberally and repeatedly throughout the day.  Whenever you are outside, protect yourself by wearing long sleeves, pants, a wide-brimmed hat, and  sunglasses. What should I know about osteoporosis? Osteoporosis is a condition in which bone destruction happens more quickly than new bone creation. After menopause, you may be at an increased risk for osteoporosis. To help prevent osteoporosis or the bone fractures that can happen because of osteoporosis, the following is recommended:  If you are 74-22 years old, get at least 1,000 mg of calcium and at least 600 mg of vitamin D per day.  If you are older than age 43 but younger than age 8, get at least 1,200 mg of calcium and at least 600 mg of vitamin D per day.  If you are  older than age 30, get at least 1,200 mg of calcium and at least 800 mg of vitamin D per day. Smoking and excessive alcohol intake increase the risk of osteoporosis. Eat foods that are rich in calcium and vitamin D, and do weight-bearing exercises several times each week as directed by your health care provider. What should I know about how menopause affects my mental health? Depression may occur at any age, but it is more common as you become older. Common symptoms of depression include:  Low or sad mood.  Changes in sleep patterns.  Changes in appetite or eating patterns.  Feeling an overall lack of motivation or enjoyment of activities that you previously enjoyed.  Frequent crying spells. Talk with your health care provider if you think that you are experiencing depression. What should I know about immunizations? It is important that you get and maintain your immunizations. These include:  Tetanus, diphtheria, and pertussis (Tdap) booster vaccine.  Influenza every year before the flu season begins.  Pneumonia vaccine.  Shingles vaccine. Your health care provider may also recommend other immunizations. This information is not intended to replace advice given to you by your health care provider. Make sure you discuss any questions you have with your health care provider. Document Released: 10/15/2005 Document Revised: 03/12/2016 Document Reviewed: 05/27/2015 Elsevier Interactive Patient Education  2017 [employer] Shingles Vaccine: What You Need to Know 1. What is shingles? Shingles is a painful skin rash, often with blisters. It is also called Herpes Zoster, or just Zoster. A shingles rash usually appears on one side of the face or body and lasts from 2 to 4 weeks. Its main symptom is pain, which can be quite severe. Other symptoms of shingles can include fever, headache, chills and upset stomach. Very rarely, a shingles infection can lead to pneumonia, hearing problems,  blindness, brain inflammation (encephalitis) or death. For about 1 person in 5, severe pain can continue even long after the rash clears up. This is called post-herpetic neuralgia. Shingles is caused by the Varicella Zoster virus, the same virus that causes chickenpox. Only someone who has had chickenpox-or, rarely, has gotten chickenpox vaccine-can get shingles. The virus stays in your body, and can cause shingles many years later. You can't catch shingles from another person with shingles. However, a person who has never had chickenpox (or chicken pox vaccine) could get chickenpox from someone with shingles. This is not very common. Shingles is far more common in people 88 years of age and older than in younger people. It is also more common in people whose immune systems are weakened because of a disease such as cancer, or drugs such as steroids or chemotherapy. At least 1 million people a year in the [place] get shingles. 2. Shingles vaccine A vaccine for shingles was licensed in [address]67. In clinical trials, the vaccine reduced  the risk of shingles by 50%. It can also reduce pain in people who still get shingles after being vaccinated. A single dose of shingles vaccine is recommended for adults 26 years of age and older. 3. Some people should not get shingles vaccine or should wait A person should not get shingles vaccine who:  has ever had a life-threatening allergic reaction to gelatin, the antibiotic neomycin, or any other component of shingles vaccine. Tell your doctor if you have any severe allergies.  has a weakened immune system because of current:  AIDS or another disease that affects the immune system,  treatment with drugs that affect the immune system, such as prolonged use of high-dose steroids,  cancer treatment such as radiation or chemotherapy,  cancer affecting the bone marrow or lymphatic system, such as leukemia or lymphoma.  is pregnant, or might be pregnant. Women  should not become pregnant until at least 4 weeks after getting shingles vaccine. Someone with a minor acute illness, such as a cold, may be vaccinated. But anyone with a moderate or severe acute illness should usually wait until they recover before get ting the vaccine. This includes anyone with a temperature of 101.3 F or higher. 4. What are the risks from shingles vaccine? A vaccine, like any medicine, could possibly cause serious problems, such as severe allergic reactions. However, the risk of a vaccine causing serious harm, or death, is extremely small. No serious problems have been identified with shingles vaccine. Mild problems   Redness, soreness, swelling, or itching at the site of the injection (about 1 person in 3).  Headache (about 1 person in 97). Like all vaccines, shingles vaccine is being closely monitored for unusual or severe problems. 5. What if there is a serious reaction? What should I look for?   Look for anything that concerns you, such as signs of a severe allergic reaction, very high fever, or behavior changes. Signs of a severe allergic reaction can include hives, swelling of the face and throat, difficulty breathing, a fast heartbeat, dizziness, and weakness. These would start a few minutes to a few hours after the vaccination. What should I do?   If you think it is a severe allergic reaction or other emergency that can't wait, call 9-1-1 or get the person to the nearest hospital. Otherwise, call your doctor.  Afterward, the reaction should be reported to the Vaccine Adverse Event Reporting System (VAERS). Your doctor might file this report, or you can do it yourself through the VAERS web site at www.vaers.SamedayNews.es or by calling 539 090 0888. VAERS is only for reporting reactions. They do not give medical advice.  6. How can I learn more?  Ask your doctor.  Call your local or state health department.  Contact the Centers for Disease Control and Prevention  (CDC):  Call (813) 134-0759(1-800-CDC-INFO) or  Visit CDC's website at http://hunter.com/ CDC Vaccine Information Statement (VIS) Shingles Vaccine (06/11/2008) This information is not intended to replace advice given to you by your health care provider. Make sure you discuss any questions you have with your health care provider. Document Released: 06/20/2006 Document Revised: 07/17/2016 Document Reviewed: 07/17/2016 Elsevier Interactive Patient Education  2017 [employer].

## 2016-11-12 NOTE — Assessment & Plan Note (Signed)
Better on recheck. Continue to monitor. Call with any concerns. Recheck 1 year. Checking microalbumin today.

## 2016-11-12 NOTE — Progress Notes (Signed)
BP 128/72   Pulse 61   Temp 97.8 F (36.6 C) (Oral)   Resp 17   Ht 5\' 2"  (1.575 m)   Wt 122 lb (55.3 kg)   SpO2 98%   BMI 22.31 kg/m    Subjective:    Patient ID: Robin Charles, female    DOB: 03-06-1956, 61 y.o.   MRN: [id number]  HPI: Robin Charles is a 61 y.o. female presenting on 11/12/2016 for comprehensive medical examination and to establish care with me as her previous provider left the practice. Current medical complaints include:  Following her last visit- she has not seen GYN, no vaginal bleeding since then. She thinks that this was from her colon issue and not her vagina Had colonoscopy in 2016, had a polyp removed surgically. Due again now with Dr. Tiffany Kocher- he is in contact with her about that.   HYPERTENSION Hypertension status: stable  Satisfied with current treatment? yes Duration of hypertension: chronic BP monitoring frequency:  a few times a month BP range: "Looks good" Previous BP meds: HCTZ Aspirin: yes Recurrent headaches: yes Visual changes: no Palpitations: no Dyspnea: no Chest pain: no Lower extremity edema: no Dizzy/lightheaded: no  She currently lives with: husband Menopausal Symptoms: yes  Depression Screen done today and results listed below:  Depression screen [facility] 2/9 11/12/2016  Decreased Interest 0  Down, Depressed, Hopeless 0  PHQ - 2 Score 0    Past Medical History:  Past Medical History:  Diagnosis Date  . Abnormal mammogram   . Cancer ([place])    skin ca  . Family history of colon cancer in mother   . Hematuria 05/15/2015  . Hypercalcemia   . Hypertension   . Pelvic mass in female 05/15/2015  . Postmenopausal atrophic vaginitis   . Postmenopausal bleeding 05/15/2015    Surgical History:  Past Surgical History:  Procedure Laterality Date  . COLONOSCOPY WITH PROPOFOL N/A 09/25/2015   Procedure: COLONOSCOPY WITH PROPOFOL;  Surgeon: Manya Silvas, MD;  Location: [facility] ENDOSCOPY;  Service: Endoscopy;  Laterality: N/A;  .  RECTAL POLYPECTOMY  11/2015   [facility]    Medications:  Current Outpatient Prescriptions on File Prior to Visit  Medication Sig  . aspirin EC 81 MG tablet Take 81 mg by mouth daily.  . cetirizine (ZYRTEC) 10 MG tablet Take 10 mg by mouth daily.  . Multiple Vitamin (MULTIVITAMIN) tablet Take 1 tablet by mouth daily.   No current facility-administered medications on file prior to visit.     Allergies:  Allergies  Allergen Reactions  . Propoxyphene     Other reaction(s): Vomiting    Social History:  Social History   Social History  . Marital status: Married    Spouse name: N/A  . Number of children: N/A  . Years of education: N/A   Occupational History  . Not on file.   Social History Main Topics  . Smoking status: Never Smoker  . Smokeless tobacco: Never Used  . Alcohol use No  . Drug use: No  . Sexual activity: Yes    Birth control/ protection: Post-menopausal   Other Topics Concern  . Not on file   Social History Narrative  . No narrative on file   History  Smoking Status  . Never Smoker  Smokeless Tobacco  . Never Used   History  Alcohol Use No    Family History:  Family History  Problem Relation Age of Onset  . Colon cancer Mother 79  . Cancer  Mother     Past medical history, surgical history, medications, allergies, family history and social history reviewed with patient today and changes made to appropriate areas of the chart.   Review of Systems  Constitutional: Positive for diaphoresis. Negative for chills, fever, malaise/fatigue and weight loss.  HENT: Positive for nosebleeds. Negative for congestion, ear discharge, ear pain, hearing loss, sinus pain, sore throat and tinnitus.   Eyes: Negative.   Respiratory: Negative.  Negative for stridor.   Cardiovascular: Negative.   Gastrointestinal: Positive for constipation. Negative for abdominal pain, blood in stool, diarrhea, heartburn, melena, nausea and vomiting.  Genitourinary: Negative.     Musculoskeletal: Negative.   Skin: Negative.   Neurological: Positive for tingling. Negative for dizziness, tremors, sensory change, speech change, focal weakness, seizures, loss of consciousness, weakness and headaches.  Endo/Heme/Allergies: Positive for environmental allergies. Negative for polydipsia. Bruises/bleeds easily.  Psychiatric/Behavioral: Negative.     All other ROS negative except what is listed above and in the HPI.      Objective:    BP 128/72   Pulse 61   Temp 97.8 F (36.6 C) (Oral)   Resp 17   Ht 5\' 2"  (1.575 m)   Wt 122 lb (55.3 kg)   SpO2 98%   BMI 22.31 kg/m   Wt Readings from Last 3 Encounters:  11/12/16 122 lb (55.3 kg)  09/25/15 117 lb (53.1 kg)  05/15/15 122 lb (55.3 kg)    Physical Exam  Constitutional: She is oriented to person, place, and time. She appears well-developed and well-nourished. No distress.  HENT:  Head: Normocephalic and atraumatic.  Right Ear: Hearing, tympanic membrane, external ear and ear canal normal.  Left Ear: Hearing, tympanic membrane, external ear and ear canal normal.  Nose: Nose normal.  Mouth/Throat: Uvula is midline, oropharynx is clear and moist and mucous membranes are normal. No oropharyngeal exudate.  Eyes: Conjunctivae, EOM and lids are normal. Pupils are equal, round, and reactive to light. Right eye exhibits no discharge. Left eye exhibits no discharge. No scleral icterus.  Neck: Normal range of motion. Neck supple. No JVD present. No tracheal deviation present. No thyromegaly present.  Cardiovascular: Normal rate, regular rhythm, normal heart sounds and intact distal pulses.  Exam reveals no gallop and no friction rub.   No murmur heard. Pulmonary/Chest: Effort normal and breath sounds normal. No stridor. No respiratory distress. She has no wheezes. She has no rales. She exhibits no tenderness. Right breast exhibits no inverted nipple, no mass, no nipple discharge, no skin change and no tenderness. Left breast  exhibits no inverted nipple, no mass, no nipple discharge, no skin change and no tenderness. Breasts are symmetrical.  Abdominal: Soft. Bowel sounds are normal. She exhibits no distension and no mass. There is no tenderness. There is no rebound and no guarding.  Genitourinary:  Genitourinary Comments: Pelvic exam deferred with shared decision making.   Musculoskeletal: Normal range of motion. She exhibits no edema, tenderness or deformity.  Lymphadenopathy:    She has no cervical adenopathy.  Neurological: She is alert and oriented to person, place, and time. She has normal reflexes. She displays normal reflexes. No cranial nerve deficit. She exhibits normal muscle tone. Coordination normal.  Skin: Skin is warm, dry and intact. No rash noted. She is not diaphoretic. No erythema. No pallor.  Psychiatric: She has a normal mood and affect. Her speech is normal and behavior is normal. Judgment and thought content normal. Cognition and memory are normal.  Nursing note and vitals  reviewed.   Results for orders placed or performed in visit on 05/15/15  WET PREP FOR [place], YEAST, CLUE  Result Value Ref Range   Trichomonas Exam Negative Negative   Yeast Exam Negative Negative   Clue Cell Exam Negative Negative  Microscopic Examination  Result Value Ref Range   WBC, UA 0-5 0 - 5 /hpf   RBC, UA 0-2 0 - 2 /hpf   Epithelial Cells (non renal) 0-10 0 - 10 /hpf   Bacteria, UA Few None seen/Few  UA/M w/rflx Culture, Routine  Result Value Ref Range   Urine Culture, Routine Final report    Urine Culture result 1 Comment   Pap liquid-based and HPV (high risk)  Result Value Ref Range   DIAGNOSIS: Comment    Specimen adequacy: Comment    CLINICIAN PROVIDED ICD10: Comment    Performed by: Comment    QC reviewed by: Comment    PAP SMEAR COMMENT .    Note: Comment       Assessment & Plan:   Problem List Items Addressed This Visit      Cardiovascular and Mediastinum   Hypertension    Better on  recheck. Continue to monitor. Call with any concerns. Recheck 1 year. Checking microalbumin today.      Relevant Orders   Microalbumin, Urine Waived    Other Visit Diagnoses    Routine general medical examination at a health care facility    -  Primary   Vaccines updated. Screening labs checked today. Pap next year. Colonoscopy shortly, Mammogram ordered. Continue diet and exercise.    Relevant Orders   CBC with Differential/Platelet   Comprehensive metabolic panel   Lipid Panel w/o Chol/HDL Ratio   Microalbumin, Urine Waived   TSH   UA/M w/rflx Culture, Routine   Screening for breast cancer       Mammogram ordered today.   Relevant Orders   MM DIGITAL SCREENING BILATERAL   Need for hepatitis C screening test       Labs drawn today. Await results.    Relevant Orders   Hepatitis C Antibody   Screening for HIV without presence of risk factors       Labs drawn today. Await results.    Relevant Orders   HIV antibody       Follow up plan: Return in about 1 year (around 11/12/2017) for Physical.   LABORATORY TESTING:  - Pap smear: up to date- no HPV testing done, will repeat next year  IMMUNIZATIONS:   - Tdap: Tetanus vaccination status reviewed: Thinks she got it at work. - Influenza: Up to date - Pneumovax: Not applicable - Prevnar: Not applicable - Zostavax vaccine: Will consider  SCREENING: -Mammogram: Ordered today  - Colonoscopy: Done elsewhere   PATIENT COUNSELING:   Advised to take 1 mg of folate supplement per day if capable of pregnancy.   Sexuality: Discussed sexually transmitted diseases, partner selection, use of condoms, avoidance of unintended pregnancy  and contraceptive alternatives.   Advised to avoid cigarette smoking.  I discussed with the patient that most people either abstain from alcohol or drink within safe limits (<=14/week and <=4 drinks/occasion for males, <=7/weeks and <= 3 drinks/occasion for females) and that the risk for alcohol  disorders and other health effects rises proportionally with the number of drinks per week and how often a drinker exceeds daily limits.  Discussed cessation/primary prevention of drug use and availability of treatment for abuse.   Diet: Encouraged to adjust caloric intake  to maintain  or achieve ideal body weight, to reduce intake of dietary saturated fat and total fat, to limit sodium intake by avoiding high sodium foods and not adding table salt, and to maintain adequate dietary potassium and calcium preferably from fresh fruits, vegetables, and low-fat dairy products.    stressed the importance of regular exercise  Injury prevention: Discussed safety belts, safety helmets, smoke detector, smoking near bedding or upholstery.   Dental health: Discussed importance of regular tooth brushing, flossing, and dental visits.    NEXT PREVENTATIVE PHYSICAL DUE IN 1 YEAR. Return in about 1 year (around 11/12/2017) for Physical.

## 2016-11-13 LAB — COMPREHENSIVE METABOLIC PANEL
A/G RATIO: 1.7 (ref 1.2–2.2)
ALBUMIN: 4.3 g/dL (ref 3.6–4.8)
ALT: 19 IU/L (ref 0–32)
AST: 19 IU/L (ref 0–40)
Alkaline Phosphatase: 102 IU/L (ref 39–117)
BUN/Creatinine Ratio: 23 (ref 12–28)
BUN: 16 mg/dL (ref 8–27)
Bilirubin Total: 0.4 mg/dL (ref 0.0–1.2)
CALCIUM: 9.6 mg/dL (ref 8.7–10.3)
CO2: 29 mmol/L (ref 18–29)
Chloride: 100 mmol/L (ref 96–106)
Creatinine, Ser: 0.7 mg/dL (ref 0.57–1.00)
GFR, EST AFRICAN AMERICAN: 108 mL/min/{1.73_m2} (ref >59)
GFR, EST NON AFRICAN AMERICAN: 94 mL/min/{1.73_m2} (ref >59)
GLOBULIN, TOTAL: 2.6 g/dL (ref 1.5–4.5)
Glucose: 82 mg/dL (ref 65–99)
POTASSIUM: 4.1 mmol/L (ref 3.5–5.2)
SODIUM: 143 mmol/L (ref 134–144)
TOTAL PROTEIN: 6.9 g/dL (ref 6.0–8.5)

## 2016-11-13 LAB — CBC WITH DIFFERENTIAL/PLATELET
Basophils Absolute: 0 10*3/uL (ref 0.0–0.2)
Basos: 1 %
EOS (ABSOLUTE): 0.1 10*3/uL (ref 0.0–0.4)
EOS: 2 %
HEMATOCRIT: 37.7 % (ref 34.0–46.6)
Hemoglobin: 12.7 g/dL (ref 11.1–15.9)
IMMATURE GRANS (ABS): 0 10*3/uL (ref 0.0–0.1)
IMMATURE GRANULOCYTES: 0 %
Lymphocytes Absolute: 3.1 10*3/uL (ref 0.7–3.1)
Lymphs: 52 %
MCH: 30.5 pg (ref 26.6–33.0)
MCHC: 33.7 g/dL (ref 31.5–35.7)
MCV: 91 fL (ref 79–97)
MONOS ABS: 0.4 10*3/uL (ref 0.1–0.9)
Monocytes: 7 %
NEUTROS PCT: 38 %
Neutrophils Absolute: 2.2 10*3/uL (ref 1.4–7.0)
Platelets: 254 10*3/uL (ref 150–379)
RBC: 4.16 x10E6/uL (ref 3.77–5.28)
RDW: 13.5 % (ref 12.3–15.4)
WBC: 5.9 10*3/uL (ref 3.4–10.8)

## 2016-11-13 LAB — LIPID PANEL W/O CHOL/HDL RATIO
Cholesterol, Total: 181 mg/dL (ref 100–199)
HDL: 78 mg/dL (ref >39)
LDL Calculated: 87 mg/dL (ref 0–99)
Triglycerides: 82 mg/dL (ref 0–149)
VLDL Cholesterol Cal: 16 mg/dL (ref 5–40)

## 2016-11-13 LAB — TSH: TSH: 1.08 u[IU]/mL (ref 0.450–4.500)

## 2016-11-13 LAB — HEPATITIS C ANTIBODY: HEP C VIRUS AB: 0.1 {s_co_ratio} (ref 0.0–0.9)

## 2016-11-13 LAB — HIV ANTIBODY (ROUTINE TESTING W REFLEX): HIV Screen 4th Generation wRfx: NONREACTIVE

## 2016-11-15 ENCOUNTER — Encounter: Payer: Self-pay | Admitting: Family Medicine

## 2016-12-09 ENCOUNTER — Encounter: Payer: Self-pay | Admitting: Family Medicine

## 2016-12-09 ENCOUNTER — Ambulatory Visit
Admission: RE | Admit: 2016-12-09 | Discharge: 2016-12-09 | Disposition: A | Discharge status: Home / Self Care | Payer: BLUE CROSS/BLUE SHIELD | Source: Ambulatory Visit | Attending: Family Medicine | Admitting: Family Medicine

## 2016-12-09 DIAGNOSIS — Z1231 Encounter for screening mammogram for malignant neoplasm of breast: Secondary | ICD-10-CM | POA: Diagnosis not present

## 2016-12-09 DIAGNOSIS — Z1239 Encounter for other screening for malignant neoplasm of breast: Secondary | ICD-10-CM

## 2017-06-14 DIAGNOSIS — Z23 Encounter for immunization: Secondary | ICD-10-CM | POA: Diagnosis not present

## 2017-12-08 ENCOUNTER — Other Ambulatory Visit: Payer: Self-pay

## 2017-12-08 ENCOUNTER — Encounter: Payer: Self-pay | Admitting: Family Medicine

## 2017-12-08 ENCOUNTER — Ambulatory Visit (INDEPENDENT_AMBULATORY_CARE_PROVIDER_SITE_OTHER): Payer: BLUE CROSS/BLUE SHIELD | Admitting: Family Medicine

## 2017-12-08 VITALS — BP 134/74 | HR 62 | Temp 97.7°F | Ht 61.5 in | Wt 122.0 lb

## 2017-12-08 DIAGNOSIS — Z1231 Encounter for screening mammogram for malignant neoplasm of breast: Secondary | ICD-10-CM | POA: Diagnosis not present

## 2017-12-08 DIAGNOSIS — Z0000 Encounter for general adult medical examination without abnormal findings: Secondary | ICD-10-CM

## 2017-12-08 DIAGNOSIS — Z1239 Encounter for other screening for malignant neoplasm of breast: Secondary | ICD-10-CM

## 2017-12-08 DIAGNOSIS — Z23 Encounter for immunization: Secondary | ICD-10-CM | POA: Diagnosis not present

## 2017-12-08 DIAGNOSIS — M7121 Synovial cyst of popliteal space [Baker], right knee: Secondary | ICD-10-CM | POA: Diagnosis not present

## 2017-12-08 DIAGNOSIS — Z124 Encounter for screening for malignant neoplasm of cervix: Secondary | ICD-10-CM

## 2017-12-08 LAB — UA/M W/RFLX CULTURE, ROUTINE
BILIRUBIN UA: NEGATIVE
Glucose, UA: NEGATIVE
KETONES UA: NEGATIVE
Nitrite, UA: NEGATIVE
PROTEIN UA: NEGATIVE
Urobilinogen, Ur: 0.2 mg/dL (ref 0.2–1.0)
pH, UA: 6 (ref 5.0–7.5)

## 2017-12-08 LAB — MICROSCOPIC EXAMINATION: Bacteria, UA: NONE SEEN

## 2017-12-08 LAB — MICROALBUMIN, URINE WAIVED
Creatinine, Urine Waived: 50 mg/dL (ref 10–300)
Microalb, Ur Waived: 10 mg/L (ref 0–19)

## 2017-12-08 NOTE — Patient Instructions (Addendum)
Health Maintenance for Postmenopausal Women Menopause is a normal process in which your reproductive ability comes to an end. This process happens gradually over a span of months to years, usually between the ages of 22 and 9. Menopause is complete when you have missed 12 consecutive menstrual periods. It is important to talk with your health care provider about some of the most common conditions that affect postmenopausal women, such as heart disease, cancer, and bone loss (osteoporosis). Adopting a healthy lifestyle and getting preventive care can help to promote your health and wellness. Those actions can also lower your chances of developing some of these common conditions. What should I know about menopause? During menopause, you may experience a number of symptoms, such as:  Moderate-to-severe hot flashes.  Night sweats.  Decrease in sex drive.  Mood swings.  Headaches.  Tiredness.  Irritability.  Memory problems.  Insomnia.  Choosing to treat or not to treat menopausal changes is an individual decision that you make with your health care provider. What should I know about hormone replacement therapy and supplements? Hormone therapy products are effective for treating symptoms that are associated with menopause, such as hot flashes and night sweats. Hormone replacement carries certain risks, especially as you become older. If you are thinking about using estrogen or estrogen with progestin treatments, discuss the benefits and risks with your health care provider. What should I know about heart disease and stroke? Heart disease, heart attack, and stroke become more likely as you age. This may be due, in part, to the hormonal changes that your body experiences during menopause. These can affect how your body processes dietary fats, triglycerides, and cholesterol. Heart attack and stroke are both medical emergencies. There are many things that you can do to help prevent heart disease  and stroke:  Have your blood pressure checked at least every 1-2 years. High blood pressure causes heart disease and increases the risk of stroke.  If you are 53-22 years old, ask your health care provider if you should take aspirin to prevent a heart attack or a stroke.  Do not use any tobacco products, including cigarettes, chewing tobacco, or electronic cigarettes. If you need help quitting, ask your health care provider.  It is important to eat a healthy diet and maintain a healthy weight. ? Be sure to include plenty of vegetables, fruits, low-fat dairy products, and lean protein. ? Avoid eating foods that are high in solid fats, added sugars, or salt (sodium).  Get regular exercise. This is one of the most important things that you can do for your health. ? Try to exercise for at least 150 minutes each week. The type of exercise that you do should increase your heart rate and make you sweat. This is known as moderate-intensity exercise. ? Try to do strengthening exercises at least twice each week. Do these in addition to the moderate-intensity exercise.  Know your numbers.Ask your health care provider to check your cholesterol and your blood glucose. Continue to have your blood tested as directed by your health care provider.  What should I know about cancer screening? There are several types of cancer. Take the following steps to reduce your risk and to catch any cancer development as early as possible. Breast Cancer  Practice breast self-awareness. ? This means understanding how your breasts normally appear and feel. ? It also means doing regular breast self-exams. Let your health care provider know about any changes, no matter how small.  If you are 40  or older, have a clinician do a breast exam (clinical breast exam or CBE) every year. Depending on your age, family history, and medical history, it may be recommended that you also have a yearly breast X-ray (mammogram).  If you  have a family history of breast cancer, talk with your health care provider about genetic screening.  If you are at high risk for breast cancer, talk with your health care provider about having an MRI and a mammogram every year.  Breast cancer (BRCA) gene test is recommended for women who have family members with BRCA-related cancers. Results of the assessment will determine the need for genetic counseling and BRCA1 and for BRCA2 testing. BRCA-related cancers include these types: ? Breast. This occurs in males or females. ? Ovarian. ? Tubal. This may also be called fallopian tube cancer. ? Cancer of the abdominal or pelvic lining (peritoneal cancer). ? Prostate. ? Pancreatic.  Cervical, Uterine, and Ovarian Cancer Your health care provider may recommend that you be screened regularly for cancer of the pelvic organs. These include your ovaries, uterus, and vagina. This screening involves a pelvic exam, which includes checking for microscopic changes to the surface of your cervix (Pap test).  For women ages 21-65, health care providers may recommend a pelvic exam and a Pap test every three years. For women ages 79-65, they may recommend the Pap test and pelvic exam, combined with testing for human papilloma virus (HPV), every five years. Some types of HPV increase your risk of cervical cancer. Testing for HPV may also be done on women of any age who have unclear Pap test results.  Other health care providers may not recommend any screening for nonpregnant women who are considered low risk for pelvic cancer and have no symptoms. Ask your health care provider if a screening pelvic exam is right for you.  If you have had past treatment for cervical cancer or a condition that could lead to cancer, you need Pap tests and screening for cancer for at least 20 years after your treatment. If Pap tests have been discontinued for you, your risk factors (such as having a new sexual partner) need to be  reassessed to determine if you should start having screenings again. Some women have medical problems that increase the chance of getting cervical cancer. In these cases, your health care provider may recommend that you have screening and Pap tests more often.  If you have a family history of uterine cancer or ovarian cancer, talk with your health care provider about genetic screening.  If you have vaginal bleeding after reaching menopause, tell your health care provider.  There are currently no reliable tests available to screen for ovarian cancer.  Lung Cancer Lung cancer screening is recommended for adults 69-62 years old who are at high risk for lung cancer because of a history of smoking. A yearly low-dose CT scan of the lungs is recommended if you:  Currently smoke.  Have a history of at least 30 pack-years of smoking and you currently smoke or have quit within the past 15 years. A pack-year is smoking an average of one pack of cigarettes per day for one year.  Yearly screening should:  Continue until it has been 15 years since you quit.  Stop if you develop a health problem that would prevent you from having lung cancer treatment.  Colorectal Cancer  This type of cancer can be detected and can often be prevented.  Routine colorectal cancer screening usually begins at  age 56 and continues through age 69.  If you have risk factors for colon cancer, your health care provider may recommend that you be screened at an earlier age.  If you have a family history of colorectal cancer, talk with your health care provider about genetic screening.  Your health care provider may also recommend using home test kits to check for hidden blood in your stool.  A small camera at the end of a tube can be used to examine your colon directly (sigmoidoscopy or colonoscopy). This is done to check for the earliest forms of colorectal cancer.  Direct examination of the colon should be repeated every  5-10 years until age 14. However, if early forms of precancerous polyps or small growths are found or if you have a family history or genetic risk for colorectal cancer, you may need to be screened more often.  Skin Cancer  Check your skin from head to toe regularly.  Monitor any moles. Be sure to tell your health care provider: ? About any new moles or changes in moles, especially if there is a change in a mole's shape or color. ? If you have a mole that is larger than the size of a pencil eraser.  If any of your family members has a history of skin cancer, especially at a young age, talk with your health care provider about genetic screening.  Always use sunscreen. Apply sunscreen liberally and repeatedly throughout the day.  Whenever you are outside, protect yourself by wearing long sleeves, pants, a wide-brimmed hat, and sunglasses.  What should I know about osteoporosis? Osteoporosis is a condition in which bone destruction happens more quickly than new bone creation. After menopause, you may be at an increased risk for osteoporosis. To help prevent osteoporosis or the bone fractures that can happen because of osteoporosis, the following is recommended:  If you are 26-9 years old, get at least 1,000 mg of calcium and at least 600 mg of vitamin D per day.  If you are older than age 88 but younger than age 71, get at least 1,200 mg of calcium and at least 600 mg of vitamin D per day.  If you are older than age 77, get at least 1,200 mg of calcium and at least 800 mg of vitamin D per day.  Smoking and excessive alcohol intake increase the risk of osteoporosis. Eat foods that are rich in calcium and vitamin D, and do weight-bearing exercises several times each week as directed by your health care provider. What should I know about how menopause affects my mental health? Depression may occur at any age, but it is more common as you become older. Common symptoms of depression  include:  Low or sad mood.  Changes in sleep patterns.  Changes in appetite or eating patterns.  Feeling an overall lack of motivation or enjoyment of activities that you previously enjoyed.  Frequent crying spells.  Talk with your health care provider if you think that you are experiencing depression. What should I know about immunizations? It is important that you get and maintain your immunizations. These include:  Tetanus, diphtheria, and pertussis (Tdap) booster vaccine.  Influenza every year before the flu season begins.  Pneumonia vaccine.  Shingles vaccine.  Your health care provider may also recommend other immunizations. This information is not intended to replace advice given to you by your health care provider. Make sure you discuss any questions you have with your health care provider. Document Released: 10/15/2005  Document Revised: 03/12/2016 Document Reviewed: 05/27/2015 Elsevier Interactive Patient Education  2018 [employer]. Tdap Vaccine (Tetanus, Diphtheria and Pertussis): What You Need to Know 1. Why get vaccinated? Tetanus, diphtheria and pertussis are very serious diseases. Tdap vaccine can protect [place] from these diseases. And, Tdap vaccine given to pregnant women can protect newborn babies against pertussis. TETANUS (Lockjaw) is rare in the [place] today. It causes painful muscle tightening and stiffness, usually all over the body.  It can lead to tightening of muscles in the head and neck so you can't open your mouth, swallow, or sometimes even breathe. Tetanus kills about 1 out of 10 people who are infected even after receiving the best medical care.  DIPHTHERIA is also rare in the [place] today. It can cause a thick coating to form in the back of the throat.  It can lead to breathing problems, heart failure, paralysis, and death.  PERTUSSIS (Whooping Cough) causes severe coughing spells, which can cause difficulty breathing, vomiting and  disturbed sleep.  It can also lead to weight loss, incontinence, and rib fractures. Up to 2 in 100 adolescents and 5 in 100 adults with pertussis are hospitalized or have complications, which could include pneumonia or death.  These diseases are caused by bacteria. Diphtheria and pertussis are spread from person to person through secretions from coughing or sneezing. Tetanus enters the body through cuts, scratches, or wounds. Before vaccines, as many as 200,000 cases of diphtheria, 200,000 cases of pertussis, and hundreds of cases of tetanus, were reported in the [place] each year. Since vaccination began, reports of cases for tetanus and diphtheria have dropped by about 99% and for pertussis by about 80%. 2. Tdap vaccine Tdap vaccine can protect adolescents and adults from tetanus, diphtheria, and pertussis. One dose of Tdap is routinely given at age 104 or 90. People who did not get Tdap at that age should get it as soon as possible. Tdap is especially important for healthcare professionals and anyone having close contact with a baby younger than 12 months. Pregnant women should get a dose of Tdap during every pregnancy, to protect the newborn from pertussis. Infants are most at risk for severe, life-threatening complications from pertussis. Another vaccine, called Td, protects against tetanus and diphtheria, but not pertussis. A Td booster should be given every 10 years. Tdap may be given as one of these boosters if you have never gotten Tdap before. Tdap may also be given after a severe cut or burn to prevent tetanus infection. Your doctor or the person giving you the vaccine can give you more information. Tdap may safely be given at the same time as other vaccines. 3. Some people should not get this vaccine  A person who has ever had a life-threatening allergic reaction after a previous dose of any diphtheria, tetanus or pertussis containing vaccine, OR has a severe allergy to any part of  this vaccine, should not get Tdap vaccine. Tell the person giving the vaccine about any severe allergies.  Anyone who had coma or long repeated seizures within 7 days after a childhood dose of DTP or DTaP, or a previous dose of Tdap, should not get Tdap, unless a cause other than the vaccine was found. They can still get Td.  Talk to your doctor if you: ? have seizures or another nervous system problem, ? had severe pain or swelling after any vaccine containing diphtheria, tetanus or pertussis, ? ever had a condition called Guillain-Barr Syndrome (GBS), ? aren't  feeling well on the day the shot is scheduled. 4. Risks With any medicine, including vaccines, there is a chance of side effects. These are usually mild and go away on their own. Serious reactions are also possible but are rare. Most people who get Tdap vaccine do not have any problems with it. Mild problems following Tdap: (Did not interfere with activities)  Pain where the shot was given (about 3 in 4 adolescents or 2 in 3 adults)  Redness or swelling where the shot was given (about 1 person in 5)  Mild fever of at least 100.62F (up to about 1 in 25 adolescents or 1 in 100 adults)  Headache (about 3 or 4 people in 10)  Tiredness (about 1 person in 3 or 4)  Nausea, vomiting, diarrhea, stomach ache (up to 1 in 4 adolescents or 1 in 10 adults)  Chills, sore joints (about 1 person in 10)  Body aches (about 1 person in 3 or 4)  Rash, swollen glands (uncommon)  Moderate problems following Tdap: (Interfered with activities, but did not require medical attention)  Pain where the shot was given (up to 1 in 5 or 6)  Redness or swelling where the shot was given (up to about 1 in 16 adolescents or 1 in 12 adults)  Fever over 102F (about 1 in 100 adolescents or 1 in 250 adults)  Headache (about 1 in 7 adolescents or 1 in 10 adults)  Nausea, vomiting, diarrhea, stomach ache (up to 1 or 3 people in 100)  Swelling of the  entire arm where the shot was given (up to about 1 in 500).  Severe problems following Tdap: (Unable to perform usual activities; required medical attention)  Swelling, severe pain, bleeding and redness in the arm where the shot was given (rare).  Problems that could happen after any vaccine:  People sometimes faint after a medical procedure, including vaccination. Sitting or lying down for about 15 minutes can help prevent fainting, and injuries caused by a fall. Tell your doctor if you feel dizzy, or have vision changes or ringing in the ears.  Some people get severe pain in the shoulder and have difficulty moving the arm where a shot was given. This happens very rarely.  Any medication can cause a severe allergic reaction. Such reactions from a vaccine are very rare, estimated at fewer than 1 in a million doses, and would happen within a few minutes to a few hours after the vaccination. As with any medicine, there is a very remote chance of a vaccine causing a serious injury or death. The safety of vaccines is always being monitored. For more information, visit: http://www.aguilar.org/ 5. What if there is a serious problem? What should I look for? Look for anything that concerns you, such as signs of a severe allergic reaction, very high fever, or unusual behavior. Signs of a severe allergic reaction can include hives, swelling of the face and throat, difficulty breathing, a fast heartbeat, dizziness, and weakness. These would usually start a few minutes to a few hours after the vaccination. What should I do?  If you think it is a severe allergic reaction or other emergency that can't wait, call 9-1-1 or get the person to the nearest hospital. Otherwise, call your doctor.  Afterward, the reaction should be reported to the Vaccine Adverse Event Reporting System (VAERS). Your doctor might file this report, or you can do it yourself through the VAERS web site at www.vaers.SamedayNews.es, or by  calling (269)[phone]. ?  VAERS does not give medical advice. 6. The National Vaccine Injury Compensation Program The [employer] Vaccine Injury Compensation Program (VICP) is a federal program that was created to compensate people who may have been injured by certain vaccines. Persons who believe they may have been injured by a vaccine can learn about the program and about filing a claim by calling 615 727 0302 or visiting the [place] website at GoldCloset.com.ee. There is a time limit to file a claim for compensation. 7. How can I learn more?  Ask your doctor. He or she can give you the vaccine package insert or suggest other sources of information.  Call your local or state health department.  Contact the Centers for Disease Control and Prevention (CDC): ? Call 684-405-7331 (1-800-CDC-INFO) or ? Visit CDC's website at http://hunter.com/ CDC Tdap Vaccine VIS (10/30/13) This information is not intended to replace advice given to you by your health care provider. Make sure you discuss any questions you have with your health care provider. Document Released: 02/22/2012 Document Revised: 05/13/2016 Document Reviewed: 05/13/2016 Elsevier Interactive Patient Education  2017 [employer] Cyst A Baker cyst, also called a popliteal cyst, is a sac-like growth that forms at the back of the knee. The cyst forms when the fluid-filled sac (bursa) that cushions the knee joint becomes enlarged. The bursa that becomes a Baker cyst is located at the back of the knee joint. What are the causes? In most cases, a Baker cyst results from another knee problem that causes swelling inside the knee. This makes the fluid inside the knee joint (synovial fluid) flow into the bursa behind the knee, causing the bursa to enlarge. What increases the risk? You may be more likely to develop a Baker cyst if you already have a knee problem, such as:  A tear in cartilage that cushions the knee joint  (meniscal tear).  A tear in the tissues that connect the bones of the knee joint (ligament tear).  Knee swelling from osteoarthritis, rheumatoid arthritis, or gout.  What are the signs or symptoms? A Baker cyst does not always cause symptoms. A lump behind the knee may be the only sign of the condition. The lump may be painful, especially when the knee is straightened. If the lump is painful, the pain may come and go. The knee may also be stiff. Symptoms may quickly get more severe if the cyst breaks open (ruptures). If your cyst ruptures, signs and symptoms may affect the knee and the back of the lower leg (calf) and may include:  Sudden or worsening pain.  Swelling.  Bruising.  How is this diagnosed? This condition may be diagnosed based on your symptoms and medical history. Your health care provider will also do a physical exam. This may include:  Feeling the cyst to check whether it is tender.  Checking your knee for signs of another knee condition that causes swelling.  You may have imaging tests, such as:  X-rays.  MRI.  Ultrasound.  How is this treated? A Baker cyst that is not painful may go away without treatment. If the cyst gets large or painful, it will likely get better if the underlying knee problem is treated. Treatment for a Baker cyst may include:  Resting.  Keeping weight off of the knee. This means not leaning on the knee to support your body weight.  NSAIDs to reduce pain and swelling.  A procedure to drain the fluid from the cyst with a needle (aspiration). You may also get an  injection of a medicine that reduces swelling (steroid).  Surgery. This may be needed if other treatments do not work. This usually involves correcting knee damage and removing the cyst.  Follow these instructions at home:  Take over-the-counter and prescription medicines only as told by your health care provider.  Rest and return to your normal activities as told by your  health care provider. Avoid activities that make pain or swelling worse. Ask your health care provider what activities are safe for you.  Keep all follow-up visits as told by your health care provider. This is important. Contact a health care provider if:  You have knee pain, stiffness, or swelling that does not get better. Get help right away if:  You have sudden or worsening pain and swelling in your calf area. This information is not intended to replace advice given to you by your health care provider. Make sure you discuss any questions you have with your health care provider. Document Released: 08/23/2005 Document Revised: 05/13/2016 Document Reviewed: 05/13/2016 Elsevier Interactive Patient Education  2018 [employer].

## 2017-12-08 NOTE — Progress Notes (Signed)
BP 134/74 (BP Location: Left Arm, Patient Position: Sitting, Cuff Size: Normal)   Pulse 62   Temp 97.7 F (36.5 C) (Oral)   Ht 5' 1.5" (1.562 m)   Wt 122 lb (55.3 kg)   SpO2 100%   BMI 22.68 kg/m    Subjective:    Patient ID: Robin Charles, female    DOB: Jan 18, 1956, 62 y.o.   MRN: [id number]  HPI: Robin Charles is a 62 y.o. female presenting on 12/08/2017 for comprehensive medical examination. Current medical complaints include:  Has been having some pain in her R knee. She notes that she has been having some pain behind her knee. She notes that it hurts when she lays flat at night. No numbness and tingling except 1x a few nights ago. Moderate in nature. No weakness, no giving out. Worse with squatting. Hasn't tried anything for it.    Overdue for colonoscopy.   She currently lives with: husband Menopausal Symptoms: yes- some hot flashes. Not bothering her much.   Depression Screen done today and results listed below:  Depression screen [facility] 2/9 12/08/2017 11/12/2016  Decreased Interest 0 0  Down, Depressed, Hopeless 0 0  PHQ - 2 Score 0 0    Past Medical History:  Past Medical History:  Diagnosis Date  . Abnormal mammogram   . Cancer ([place])    skin ca  . Family history of colon cancer in mother   . Hematuria 05/15/2015  . Hypercalcemia   . Hypertension   . Pelvic mass in female 05/15/2015  . Postmenopausal atrophic vaginitis   . Postmenopausal bleeding 05/15/2015    Surgical History:  Past Surgical History:  Procedure Laterality Date  . COLONOSCOPY WITH PROPOFOL N/A 09/25/2015   Procedure: COLONOSCOPY WITH PROPOFOL;  Surgeon: Manya Silvas, MD;  Location: [facility] ENDOSCOPY;  Service: Endoscopy;  Laterality: N/A;  . RECTAL POLYPECTOMY  11/2015   [facility]    Medications:  Current Outpatient Medications on File Prior to Visit  Medication Sig  . aspirin EC 81 MG tablet Take 81 mg by mouth daily.  . cetirizine (ZYRTEC) 10 MG tablet Take 10 mg by mouth daily.  Marland Kitchen  ibuprofen (ADVIL,MOTRIN) 200 MG tablet Take by mouth.  . Multiple Vitamin (MULTIVITAMIN) tablet Take 1 tablet by mouth daily.   No current facility-administered medications on file prior to visit.     Allergies:  Allergies  Allergen Reactions  . Propoxyphene     Other reaction(s): Vomiting    Social History:  Social History   Socioeconomic History  . Marital status: Married    Spouse name: Not on file  . Number of children: Not on file  . Years of education: Not on file  . Highest education level: Not on file  Occupational History  . Not on file  Social Needs  . Financial resource strain: Not on file  . Food insecurity:    Worry: Not on file    Inability: Not on file  . Transportation needs:    Medical: Not on file    Non-medical: Not on file  Tobacco Use  . Smoking status: Never Smoker  . Smokeless tobacco: Never Used  Substance and Sexual Activity  . Alcohol use: No  . Drug use: No  . Sexual activity: Yes    Birth control/protection: Post-menopausal  Lifestyle  . Physical activity:    Days per week: Not on file    Minutes per session: Not on file  . Stress: Not on file  Relationships  . Social connections:    Talks on phone: Not on file    Gets together: Not on file    Attends religious service: Not on file    Active member of club or organization: Not on file    Attends meetings of clubs or organizations: Not on file    Relationship status: Not on file  . Intimate partner violence:    Fear of current or ex partner: Not on file    Emotionally abused: Not on file    Physically abused: Not on file    Forced sexual activity: Not on file  Other Topics Concern  . Not on file  Social History Narrative  . Not on file   Social History   Tobacco Use  Smoking Status Never Smoker  Smokeless Tobacco Never Used   Social History   Substance and Sexual Activity  Alcohol Use No    Family History:  Family History  Problem Relation Age of Onset  . Colon  cancer Mother 62  . Cancer Mother     Past medical history, surgical history, medications, allergies, family history and social history reviewed with patient today and changes made to appropriate areas of the chart.   Review of Systems  Constitutional: Negative.   HENT: Positive for tinnitus (mainly at night). Negative for congestion, ear discharge, ear pain, hearing loss, nosebleeds, sinus pain and sore throat.   Eyes: Positive for blurred vision (needs to see eye doctor). Negative for double vision, photophobia, pain, discharge and redness.       Occasional floater  Respiratory: Negative.  Negative for stridor.   Cardiovascular: Negative.   Gastrointestinal: Positive for constipation and diarrhea. Negative for abdominal pain, blood in stool, heartburn, melena, nausea and vomiting.  Genitourinary: Negative.   Musculoskeletal: Positive for joint pain. Negative for back pain, falls, myalgias and neck pain.  Skin: Negative.   Neurological: Negative.   Endo/Heme/Allergies: Positive for environmental allergies. Negative for polydipsia. Does not bruise/bleed easily.  Psychiatric/Behavioral: Negative.     All other ROS negative except what is listed above and in the HPI.      Objective:    BP 134/74 (BP Location: Left Arm, Patient Position: Sitting, Cuff Size: Normal)   Pulse 62   Temp 97.7 F (36.5 C) (Oral)   Ht 5' 1.5" (1.562 m)   Wt 122 lb (55.3 kg)   SpO2 100%   BMI 22.68 kg/m   Wt Readings from Last 3 Encounters:  12/08/17 122 lb (55.3 kg)  11/12/16 122 lb (55.3 kg)  09/25/15 117 lb (53.1 kg)    Physical Exam  Constitutional: She is oriented to person, place, and time. She appears well-developed and well-nourished. No distress.  HENT:  Head: Normocephalic and atraumatic.  Right Ear: Hearing, tympanic membrane, external ear and ear canal normal.  Left Ear: Hearing, tympanic membrane, external ear and ear canal normal.  Nose: Nose normal.  Mouth/Throat: Uvula is midline,  oropharynx is clear and moist and mucous membranes are normal. No oropharyngeal exudate.  Eyes: Pupils are equal, round, and reactive to light. Conjunctivae, EOM and lids are normal. Right eye exhibits no discharge. Left eye exhibits no discharge. No scleral icterus.  Neck: Normal range of motion. Neck supple. No JVD present. No tracheal deviation present. No thyromegaly present.  Cardiovascular: Normal rate, regular rhythm, normal heart sounds and intact distal pulses. Exam reveals no gallop and no friction rub.  No murmur heard. Pulmonary/Chest: Effort normal and breath sounds normal. No stridor.  No respiratory distress. She has no wheezes. She has no rales. She exhibits no tenderness. Right breast exhibits no inverted nipple, no mass, no nipple discharge, no skin change and no tenderness. Left breast exhibits no inverted nipple, no mass, no nipple discharge, no skin change and no tenderness. Breasts are symmetrical.  Abdominal: Soft. Bowel sounds are normal. She exhibits no distension and no mass. There is no tenderness. There is no rebound and no guarding. Hernia confirmed negative in the right inguinal area and confirmed negative in the left inguinal area.  Genitourinary: Vagina normal and uterus normal. Rectal exam shows guaiac negative stool. No labial fusion. There is no rash, tenderness, lesion or injury on the right labia. There is no rash, tenderness, lesion or injury on the left labia. Uterus is not deviated, not enlarged, not fixed and not tender. Cervix exhibits no motion tenderness, no discharge and no friability. Right adnexum displays no mass, no tenderness and no fullness. Left adnexum displays no mass, no tenderness and no fullness. No erythema, tenderness or bleeding in the vagina. No foreign body in the vagina. No signs of injury around the vagina. No vaginal discharge found.  Musculoskeletal: Normal range of motion. She exhibits edema (Baker's cyst behind R knee). She exhibits no  tenderness or deformity.  Lymphadenopathy:    She has no cervical adenopathy.  Neurological: She is alert and oriented to person, place, and time. She has normal reflexes. She displays normal reflexes. No cranial nerve deficit. She exhibits normal muscle tone. Coordination normal.  Skin: Skin is warm, dry and intact. No rash noted. She is not diaphoretic. No erythema. No pallor.  Psychiatric: She has a normal mood and affect. Her speech is normal and behavior is normal. Judgment and thought content normal. Cognition and memory are normal.  Nursing note and vitals reviewed.   Results for orders placed or performed in visit on 11/12/16  Microscopic Examination  Result Value Ref Range   WBC, UA 0-5 0 - 5 /hpf   RBC, UA None seen 0 - 2 /hpf   Epithelial Cells (non renal) 0-10 0 - 10 /hpf   Bacteria, UA None seen None seen/Few  CBC with Differential/Platelet  Result Value Ref Range   WBC 5.9 3.4 - 10.8 x10E3/uL   RBC 4.16 3.77 - 5.28 x10E6/uL   Hemoglobin 12.7 11.1 - 15.9 g/dL   Hematocrit 37.7 34.0 - 46.6 %   MCV 91 79 - 97 fL   MCH 30.5 26.6 - 33.0 pg   MCHC 33.7 31.5 - 35.7 g/dL   RDW 13.5 12.3 - 15.4 %   Platelets 254 150 - 379 x10E3/uL   Neutrophils 38 Not Estab. %   Lymphs 52 Not Estab. %   Monocytes 7 Not Estab. %   Eos 2 Not Estab. %   Basos 1 Not Estab. %   Neutrophils Absolute 2.2 1.4 - 7.0 x10E3/uL   Lymphocytes Absolute 3.1 0.7 - 3.1 x10E3/uL   Monocytes Absolute 0.4 0.1 - 0.9 x10E3/uL   EOS (ABSOLUTE) 0.1 0.0 - 0.4 x10E3/uL   Basophils Absolute 0.0 0.0 - 0.2 x10E3/uL   Immature Granulocytes 0 Not Estab. %   Immature Grans (Abs) 0.0 0.0 - 0.1 x10E3/uL  Comprehensive metabolic panel  Result Value Ref Range   Glucose 82 65 - 99 mg/dL   BUN 16 8 - 27 mg/dL   Creatinine, Ser 0.70 0.57 - 1.00 mg/dL   GFR calc non Af Amer 94 >59 mL/min/1.73   GFR calc Af Wyvonnia Lora  108 >59 mL/min/1.73   BUN/Creatinine Ratio 23 12 - 28   Sodium 143 134 - 144 mmol/L   Potassium 4.1 3.5 - 5.2  mmol/L   Chloride 100 96 - 106 mmol/L   CO2 29 18 - 29 mmol/L   Calcium 9.6 8.7 - 10.3 mg/dL   Total Protein 6.9 6.0 - 8.5 g/dL   Albumin 4.3 3.6 - 4.8 g/dL   Globulin, Total 2.6 1.5 - 4.5 g/dL   Albumin/Globulin Ratio 1.7 1.2 - 2.2   Bilirubin Total 0.4 0.0 - 1.2 mg/dL   Alkaline Phosphatase 102 39 - 117 IU/L   AST 19 0 - 40 IU/L   ALT 19 0 - 32 IU/L  Lipid Panel w/o Chol/HDL Ratio  Result Value Ref Range   Cholesterol, Total 181 100 - 199 mg/dL   Triglycerides 82 0 - 149 mg/dL   HDL 78 >39 mg/dL   VLDL Cholesterol Cal 16 5 - 40 mg/dL   LDL Calculated 87 0 - 99 mg/dL  Microalbumin, Urine Waived  Result Value Ref Range   Microalb, Ur Waived 10 0 - 19 mg/L   Creatinine, Urine Waived 50 10 - 300 mg/dL   Microalb/Creat Ratio <30 <30 mg/g  TSH  Result Value Ref Range   TSH 1.080 0.450 - 4.500 uIU/mL  UA/M w/rflx Culture, Routine  Result Value Ref Range   Specific Gravity, UA 1.010 1.005 - 1.030   pH, UA 7.0 5.0 - 7.5   Color, UA Yellow Yellow   Appearance Ur Clear Clear   Leukocytes, UA Trace (A) Negative   Protein, UA Negative Negative/Trace   Glucose, UA Negative Negative   Ketones, UA Negative Negative   RBC, UA Trace (A) Negative   Bilirubin, UA Negative Negative   Urobilinogen, Ur 0.2 0.2 - 1.0 mg/dL   Nitrite, UA Negative Negative   Microscopic Examination See below:   Hepatitis C Antibody  Result Value Ref Range   Hep C Virus Ab 0.1 0.0 - 0.9 s/co ratio  HIV antibody  Result Value Ref Range   HIV Screen 4th Generation wRfx Non Reactive Non Reactive      Assessment & Plan:   Problem List Items Addressed This Visit    None    Visit Diagnoses    Routine general medical examination at a health care facility    -  Primary   Vaccines updated. Screening labs checked today. Pap done. Colon to be rescheduled. Mammogram ordered. Call with any concerns. Continue diet and exercise.    Relevant Orders   CBC with Differential/Platelet   Comprehensive metabolic panel     UA/M w/rflx Culture, Routine   TSH   Microalbumin, Urine Waived   Lipid Panel w/o Chol/HDL Ratio   Screening for breast cancer       Mammogram ordered today.   Relevant Orders   MM Digital Screening   Screening for cervical cancer       Pap done today.   Relevant Orders   IGP, Aptima HPV, rfx 16/18,45   Need for Tdap vaccination       Tdap given today.   Relevant Orders   Tdap vaccine greater than or equal to 7yo IM (Completed)   Synovial cyst of right popliteal space       Options discussed. Will call with what she'd like to do. Call with any concerns.        Follow up plan: Return in about 1 year (around 12/09/2018) for Physical.   LABORATORY TESTING:  -  Pap smear: pap done  IMMUNIZATIONS:   - Tdap: Tetanus vaccination status reviewed: Tdap vaccination indicated and given today. - Influenza: Up to date - Pneumovax: Not applicable - Prevnar: Not applicable - Zostavax vaccine: Holding off due to insurance  SCREENING: -Mammogram: Ordered today  - Colonoscopy: Ordered today  - Bone Density: Not applicable   PATIENT COUNSELING:   Advised to take 1 mg of folate supplement per day if capable of pregnancy.   Sexuality: Discussed sexually transmitted diseases, partner selection, use of condoms, avoidance of unintended pregnancy  and contraceptive alternatives.   Advised to avoid cigarette smoking.  I discussed with the patient that most people either abstain from alcohol or drink within safe limits (<=14/week and <=4 drinks/occasion for males, <=7/weeks and <= 3 drinks/occasion for females) and that the risk for alcohol disorders and other health effects rises proportionally with the number of drinks per week and how often a drinker exceeds daily limits.  Discussed cessation/primary prevention of drug use and availability of treatment for abuse.   Diet: Encouraged to adjust caloric intake to maintain  or achieve ideal body weight, to reduce intake of dietary saturated fat  and total fat, to limit sodium intake by avoiding high sodium foods and not adding table salt, and to maintain adequate dietary potassium and calcium preferably from fresh fruits, vegetables, and low-fat dairy products.    stressed the importance of regular exercise  Injury prevention: Discussed safety belts, safety helmets, smoke detector, smoking near bedding or upholstery.   Dental health: Discussed importance of regular tooth brushing, flossing, and dental visits.    NEXT PREVENTATIVE PHYSICAL DUE IN 1 YEAR. Return in about 1 year (around 12/09/2018) for Physical.

## 2017-12-09 ENCOUNTER — Encounter: Payer: Self-pay | Admitting: Family Medicine

## 2017-12-09 LAB — CBC WITH DIFFERENTIAL/PLATELET
Basophils Absolute: 0 10*3/uL (ref 0.0–0.2)
Basos: 1 %
EOS (ABSOLUTE): 0.1 10*3/uL (ref 0.0–0.4)
EOS: 2 %
HEMATOCRIT: 37.3 % (ref 34.0–46.6)
HEMOGLOBIN: 12.7 g/dL (ref 11.1–15.9)
Immature Grans (Abs): 0 10*3/uL (ref 0.0–0.1)
Immature Granulocytes: 0 %
LYMPHS ABS: 2.8 10*3/uL (ref 0.7–3.1)
Lymphs: 47 %
MCH: 30.2 pg (ref 26.6–33.0)
MCHC: 34 g/dL (ref 31.5–35.7)
MCV: 89 fL (ref 79–97)
MONOCYTES: 8 %
Monocytes Absolute: 0.5 10*3/uL (ref 0.1–0.9)
Neutrophils Absolute: 2.4 10*3/uL (ref 1.4–7.0)
Neutrophils: 42 %
Platelets: 251 10*3/uL (ref 150–379)
RBC: 4.21 x10E6/uL (ref 3.77–5.28)
RDW: 14.3 % (ref 12.3–15.4)
WBC: 5.8 10*3/uL (ref 3.4–10.8)

## 2017-12-09 LAB — COMPREHENSIVE METABOLIC PANEL
ALT: 16 IU/L (ref 0–32)
AST: 20 IU/L (ref 0–40)
Albumin/Globulin Ratio: 1.9 (ref 1.2–2.2)
Albumin: 4.5 g/dL (ref 3.6–4.8)
Alkaline Phosphatase: 104 IU/L (ref 39–117)
BUN / CREAT RATIO: 19 (ref 12–28)
BUN: 13 mg/dL (ref 8–27)
Bilirubin Total: 0.3 mg/dL (ref 0.0–1.2)
CO2: 29 mmol/L (ref 20–29)
CREATININE: 0.7 mg/dL (ref 0.57–1.00)
Calcium: 10 mg/dL (ref 8.7–10.3)
Chloride: 99 mmol/L (ref 96–106)
GFR calc non Af Amer: 93 mL/min/{1.73_m2} (ref >59)
GFR, EST AFRICAN AMERICAN: 107 mL/min/{1.73_m2} (ref >59)
GLOBULIN, TOTAL: 2.4 g/dL (ref 1.5–4.5)
Glucose: 87 mg/dL (ref 65–99)
Potassium: 4.1 mmol/L (ref 3.5–5.2)
SODIUM: 142 mmol/L (ref 134–144)
Total Protein: 6.9 g/dL (ref 6.0–8.5)

## 2017-12-09 LAB — LIPID PANEL W/O CHOL/HDL RATIO
Cholesterol, Total: 186 mg/dL (ref 100–199)
HDL: 85 mg/dL (ref >39)
LDL Calculated: 91 mg/dL (ref 0–99)
Triglycerides: 49 mg/dL (ref 0–149)
VLDL CHOLESTEROL CAL: 10 mg/dL (ref 5–40)

## 2017-12-09 LAB — TSH: TSH: 1.02 u[IU]/mL (ref 0.450–4.500)

## 2017-12-12 LAB — IGP, APTIMA HPV, RFX 16/18,45
HPV APTIMA: NEGATIVE
PAP SMEAR COMMENT: 0

## 2018-03-19 IMAGING — MG MM DIGITAL SCREENING BILAT W/ TOMO W/ CAD
9 of 14 series · 9 of 30 positions shown · non-contrast
Comparison: Previous exam(s).

CLINICAL DATA: Screening.

EXAM:
2D DIGITAL SCREENING BILATERAL MAMMOGRAM WITH CAD AND ADJUNCT TOMO

[L XCCL]
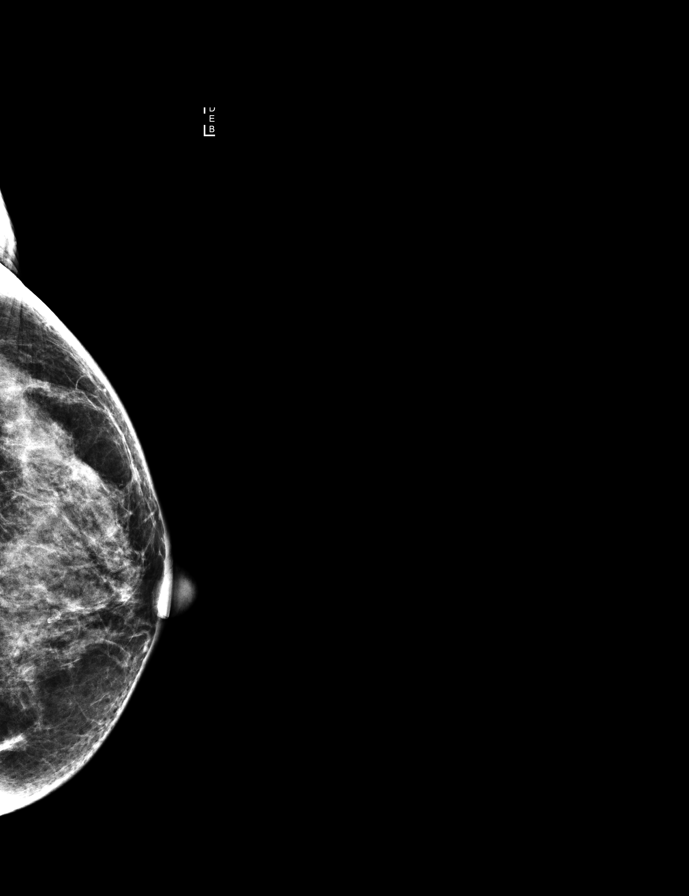

[R XCCL]
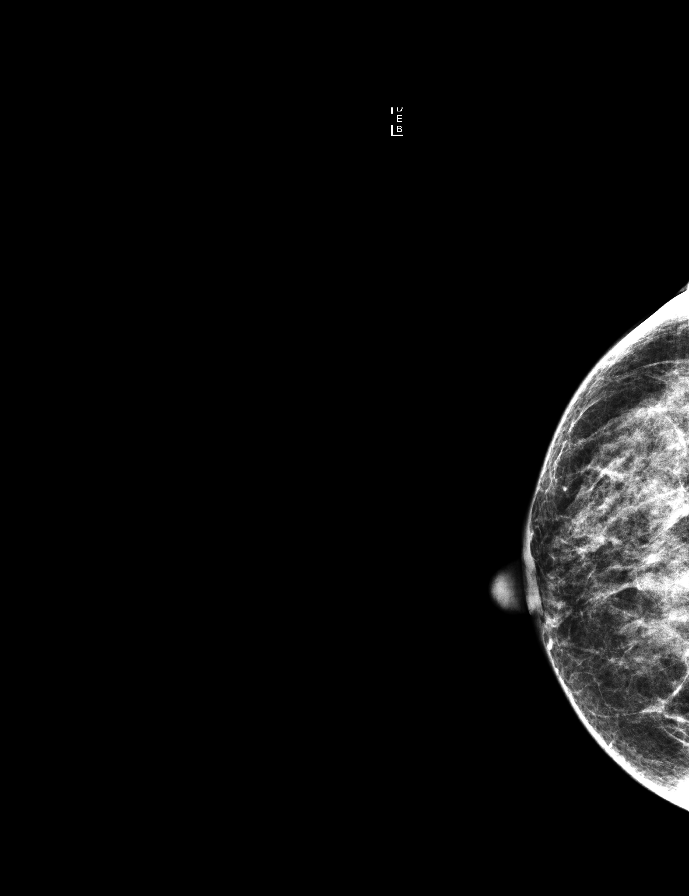

[L MLO]
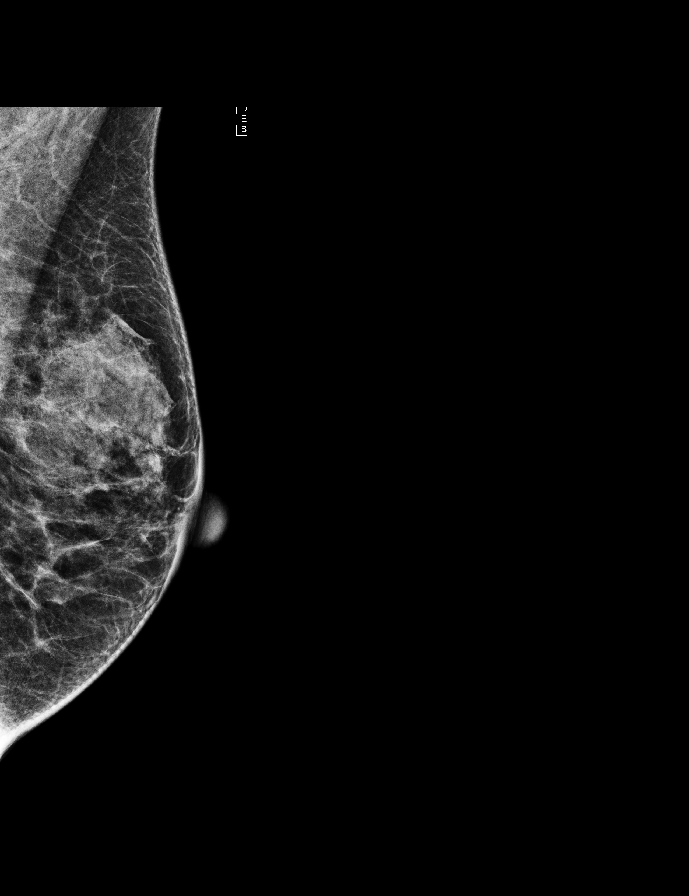

[L CC]
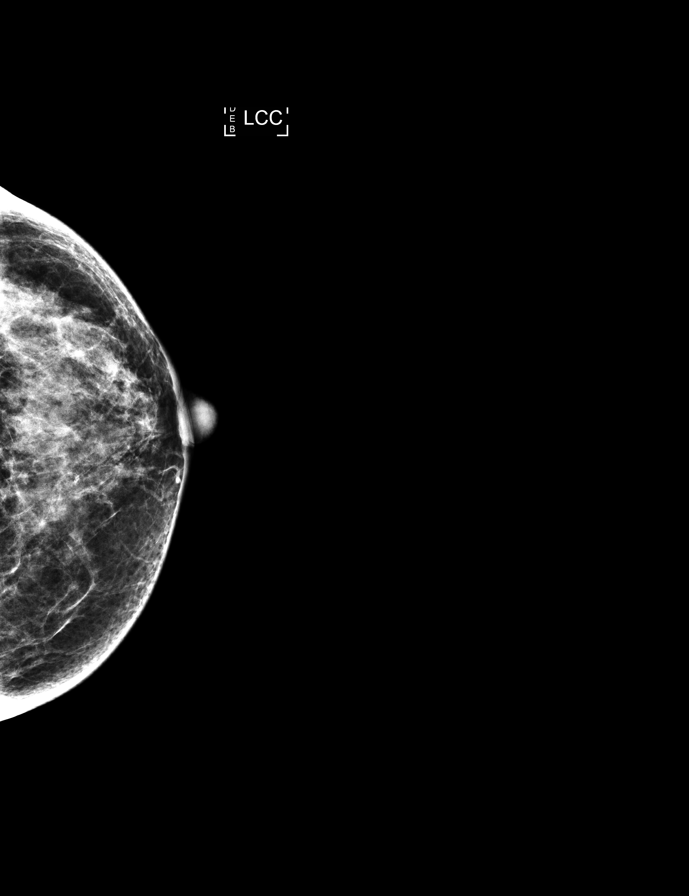

[R CC]
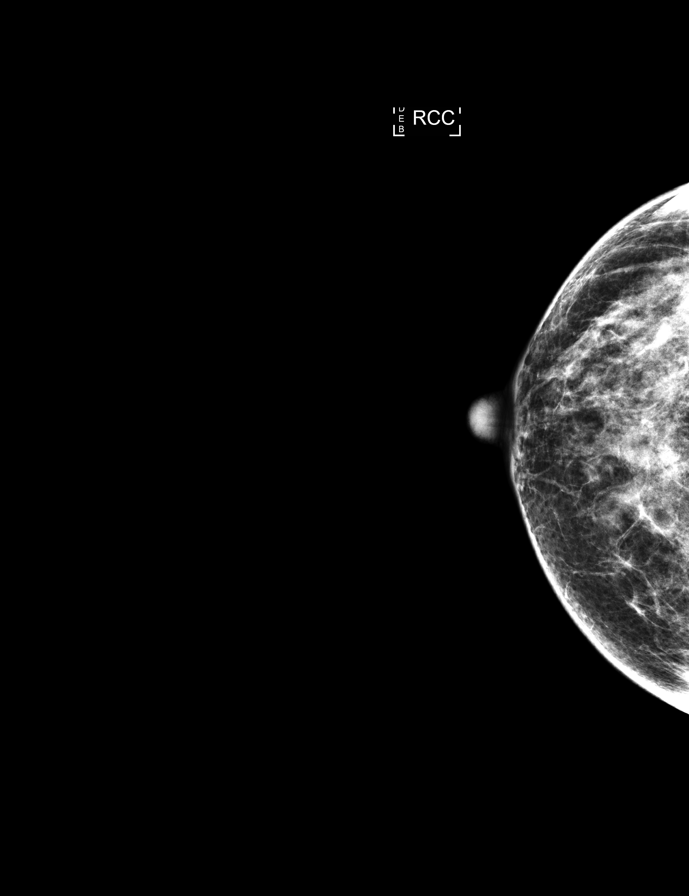

[R CC synth-2D]
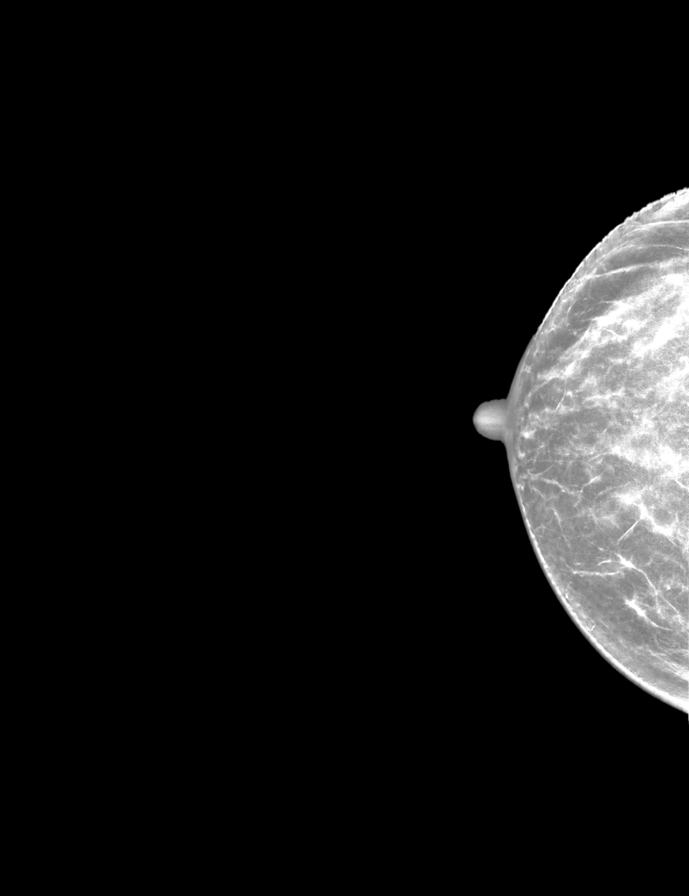

[R MLO synth-2D]
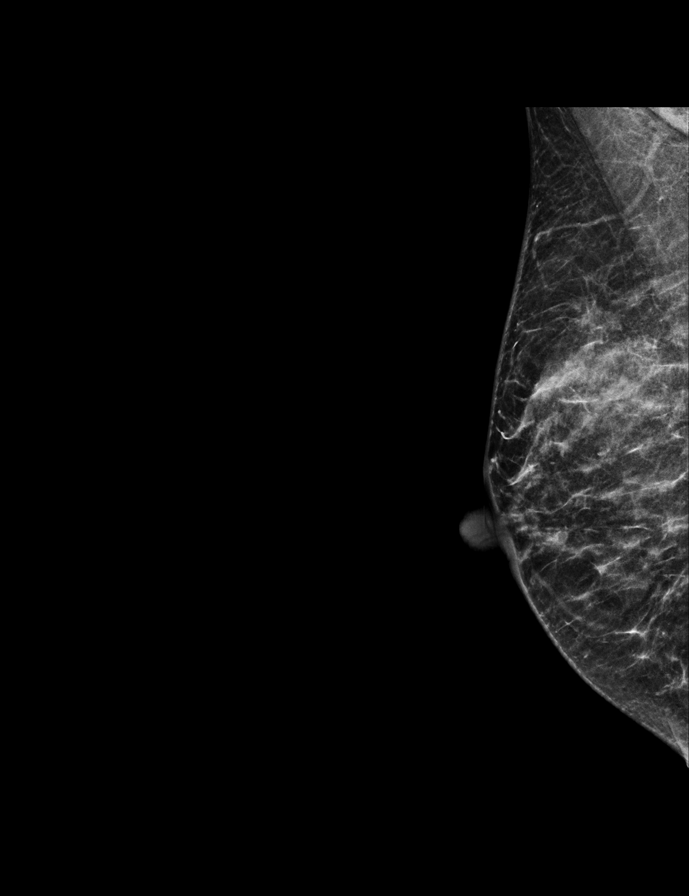

[L MLO synth-2D]
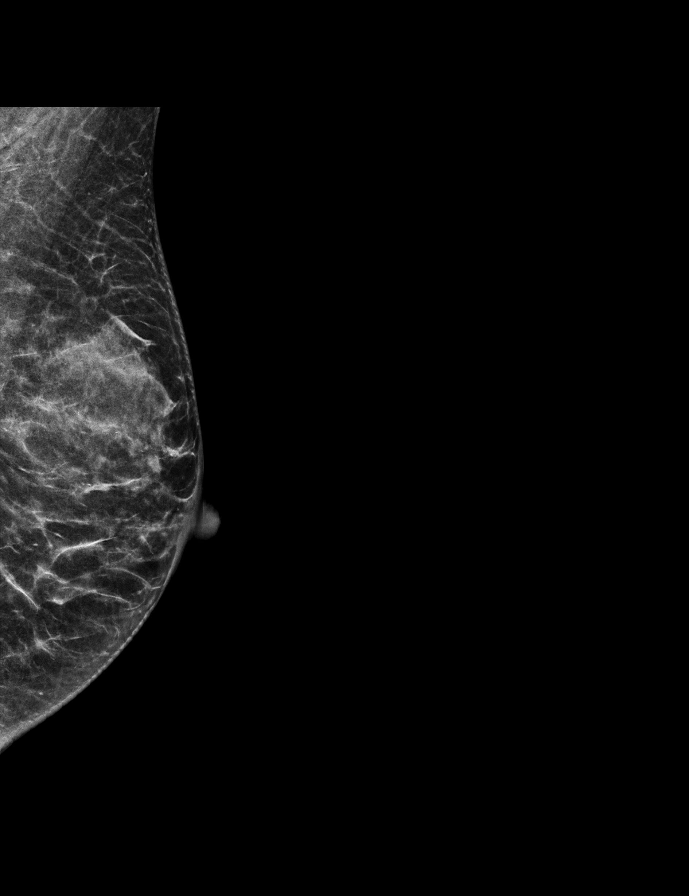

[R MLO]
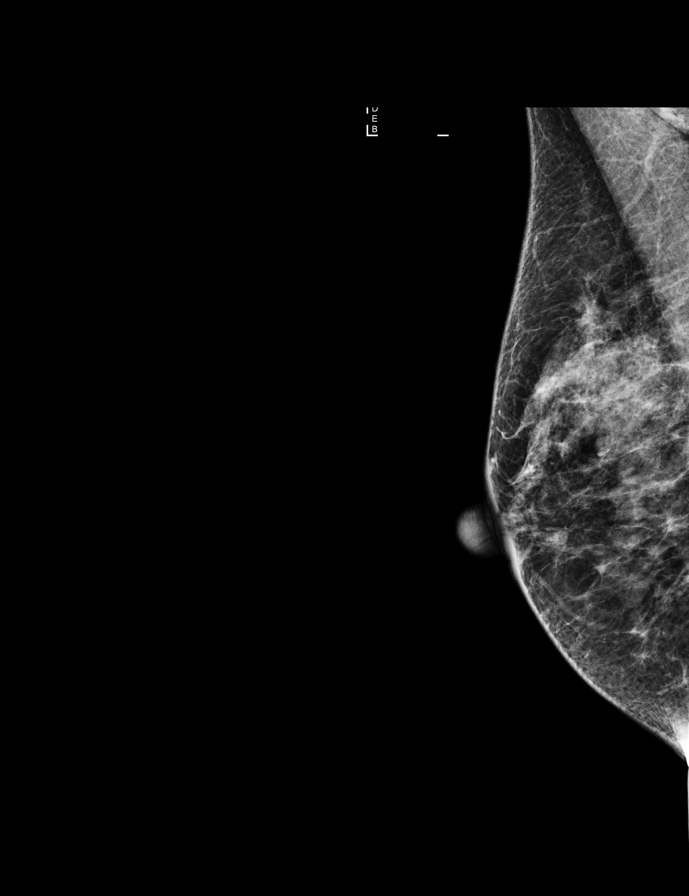

[9 of 30 positions shown; findings below may reference images not displayed]

ACR Breast Density Category c: The breast tissue is heterogeneously
dense, which may obscure small masses.
FINDINGS: There are no findings suspicious for malignancy. Images were
processed with CAD.
IMPRESSION: No mammographic evidence of malignancy. A result letter of this
screening mammogram will be mailed directly to the patient.

RECOMMENDATION:
Screening mammogram in one year. (Code:[id number])

BI-RADS CATEGORY  1: Negative.

## 2018-06-22 DIAGNOSIS — Z23 Encounter for immunization: Secondary | ICD-10-CM | POA: Diagnosis not present

## 2018-09-21 DIAGNOSIS — R1032 Left lower quadrant pain: Secondary | ICD-10-CM | POA: Diagnosis not present

## 2018-09-21 DIAGNOSIS — K625 Hemorrhage of anus and rectum: Secondary | ICD-10-CM | POA: Diagnosis not present

## 2018-09-21 DIAGNOSIS — R197 Diarrhea, unspecified: Secondary | ICD-10-CM | POA: Diagnosis not present

## 2018-09-21 DIAGNOSIS — R1031 Right lower quadrant pain: Secondary | ICD-10-CM | POA: Diagnosis not present

## 2019-06-02 DIAGNOSIS — Z23 Encounter for immunization: Secondary | ICD-10-CM | POA: Diagnosis not present

## 2019-06-04 ENCOUNTER — Telehealth: Payer: Self-pay | Admitting: Family Medicine

## 2019-06-04 DIAGNOSIS — Z1211 Encounter for screening for malignant neoplasm of colon: Secondary | ICD-10-CM

## 2019-06-04 NOTE — Telephone Encounter (Signed)
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 Allen Norris- new referral generated

## 2019-06-06 ENCOUNTER — Telehealth: Payer: Self-pay

## 2019-06-06 ENCOUNTER — Other Ambulatory Visit: Payer: Self-pay

## 2019-06-06 DIAGNOSIS — Z1211 Encounter for screening for malignant neoplasm of colon: Secondary | ICD-10-CM

## 2019-06-06 DIAGNOSIS — Z800 Family history of malignant neoplasm of digestive organs: Secondary | ICD-10-CM

## 2019-06-06 MED ORDER — NA SULFATE-K SULFATE-MG SULF 17.5-3.13-1.6 GM/177ML PO SOLN: 1.0000 | Freq: Once | ORAL | 0 refills | Status: AC

## 2019-06-06 NOTE — Telephone Encounter (Signed)
Gastroenterology Pre-Procedure Review  Request Date: 06/21/19 Requesting Physician: Dr. Allen Norris  PATIENT REVIEW QUESTIONS: The patient responded to the following health history questions as indicated:    1. Are you having any GI issues? no 2. Do you have a personal history of Polyps? yes 3. Do you have a family history of Colon Cancer or Polyps? yes (mother colon cancer) 4. Diabetes Mellitus? no 5. Joint replacements in the past 12 months?no 6. Major health problems in the past 3 months?no 7. Any artificial heart valves, MVP, or defibrillator?no    MEDICATIONS & ALLERGIES:    Patient reports the following regarding taking any anticoagulation/antiplatelet therapy:   Plavix, Coumadin, Eliquis, Xarelto, Lovenox, Pradaxa, Brilinta, or Effient? no Aspirin? yes (81 mg daily)  Patient confirms/reports the following medications:  Current Outpatient Medications  Medication Sig Dispense Refill  . aspirin EC 81 MG tablet Take 81 mg by mouth daily.    . cetirizine (ZYRTEC) 10 MG tablet Take 10 mg by mouth daily.    Marland Kitchen ibuprofen (ADVIL,MOTRIN) 200 MG tablet Take by mouth.    . Multiple Vitamin (MULTIVITAMIN) tablet Take 1 tablet by mouth daily.     No current facility-administered medications for this visit.     Patient confirms/reports the following allergies:  Allergies  Allergen Reactions  . Propoxyphene     Other reaction(s): Vomiting    No orders of the defined types were placed in this encounter.   AUTHORIZATION INFORMATION Primary Insurance: 1D#: Group #:  Secondary Insurance: 1D#: Group #:  SCHEDULE INFORMATION: Date: 06/21/19 Time: Location:[facility]

## 2019-06-14 ENCOUNTER — Encounter: Payer: Self-pay | Admitting: *Deleted

## 2019-06-14 ENCOUNTER — Other Ambulatory Visit: Payer: Self-pay

## 2019-06-14 NOTE — Anesthesia Preprocedure Evaluation (Addendum)
Anesthesia Evaluation  Patient identified by MRN, date of birth, ID band Patient awake    Reviewed: Allergy & Precautions, NPO status , Patient's Chart, lab work & pertinent test results  History of Anesthesia Complications (+) PONVNegative for: history of anesthetic complications  Airway Mallampati: II  TM Distance: >3 FB Neck ROM: Full    Dental   Pulmonary    breath sounds clear to auscultation       Cardiovascular hypertension, (-) angina(-) DOE  Rhythm:Regular Rate:Normal     Neuro/Psych    GI/Hepatic neg GERD  ,  Endo/Other   Hypercalcemia  Renal/GU      Musculoskeletal   Abdominal   Peds  Hematology   Anesthesia Other Findings Skin cancer  Reproductive/Obstetrics                          Anesthesia Physical Anesthesia Plan  ASA: I  Anesthesia Plan: General   Post-op Pain Management:    Induction: Intravenous  PONV Risk Score and Plan: 3 and Propofol infusion, TIVA and Treatment may vary due to age or medical condition  Airway Management Planned: Natural Airway and Nasal Cannula  Additional Equipment:   Intra-op Plan:   Post-operative Plan:   Informed Consent: I have reviewed the patients History and Physical, chart, labs and discussed the procedure including the risks, benefits and alternatives for the proposed anesthesia with the patient or authorized representative who has indicated his/her understanding and acceptance.       Plan Discussed with: CRNA and Anesthesiologist  Anesthesia Plan Comments:        Anesthesia Quick Evaluation

## 2019-06-15 NOTE — Discharge Instructions (Signed)

## 2019-06-18 ENCOUNTER — Other Ambulatory Visit: Payer: Self-pay

## 2019-06-18 ENCOUNTER — Other Ambulatory Visit
Admission: RE | Admit: 2019-06-18 | Discharge: 2019-06-18 | Disposition: A | Discharge status: Home / Self Care | Payer: BC Managed Care – PPO | Source: Ambulatory Visit | Attending: Gastroenterology | Admitting: Gastroenterology

## 2019-06-18 DIAGNOSIS — Z01812 Encounter for preprocedural laboratory examination: Secondary | ICD-10-CM | POA: Diagnosis not present

## 2019-06-18 DIAGNOSIS — Z20828 Contact with and (suspected) exposure to other viral communicable diseases: Secondary | ICD-10-CM | POA: Insufficient documentation

## 2019-06-18 LAB — SARS CORONAVIRUS 2 (TAT 6-24 HRS): SARS Coronavirus 2: NEGATIVE

## 2019-06-19 ENCOUNTER — Encounter: Payer: BLUE CROSS/BLUE SHIELD | Admitting: Family Medicine

## 2019-06-21 ENCOUNTER — Ambulatory Visit: Payer: BC Managed Care – PPO | Admitting: Anesthesiology

## 2019-06-21 ENCOUNTER — Ambulatory Visit
Admission: RE | Admit: 2019-06-21 | Discharge: 2019-06-21 | Disposition: A | Discharge status: Home / Self Care | Payer: BC Managed Care – PPO | Attending: Gastroenterology | Admitting: Gastroenterology

## 2019-06-21 ENCOUNTER — Other Ambulatory Visit: Payer: Self-pay

## 2019-06-21 ENCOUNTER — Encounter
Admission: RE | Disposition: A | Discharge status: Home / Self Care | Payer: Self-pay | Source: Home / Self Care | Attending: Gastroenterology

## 2019-06-21 DIAGNOSIS — Z1211 Encounter for screening for malignant neoplasm of colon: Secondary | ICD-10-CM | POA: Diagnosis not present

## 2019-06-21 DIAGNOSIS — Z86010 Personal history of colonic polyps: Secondary | ICD-10-CM | POA: Insufficient documentation

## 2019-06-21 DIAGNOSIS — K6289 Other specified diseases of anus and rectum: Secondary | ICD-10-CM | POA: Insufficient documentation

## 2019-06-21 DIAGNOSIS — Z79899 Other long term (current) drug therapy: Secondary | ICD-10-CM | POA: Insufficient documentation

## 2019-06-21 DIAGNOSIS — Z791 Long term (current) use of non-steroidal anti-inflammatories (NSAID): Secondary | ICD-10-CM | POA: Insufficient documentation

## 2019-06-21 DIAGNOSIS — K635 Polyp of colon: Secondary | ICD-10-CM

## 2019-06-21 DIAGNOSIS — Z800 Family history of malignant neoplasm of digestive organs: Secondary | ICD-10-CM

## 2019-06-21 DIAGNOSIS — Z7982 Long term (current) use of aspirin: Secondary | ICD-10-CM | POA: Diagnosis not present

## 2019-06-21 DIAGNOSIS — Z809 Family history of malignant neoplasm, unspecified: Secondary | ICD-10-CM | POA: Insufficient documentation

## 2019-06-21 DIAGNOSIS — D122 Benign neoplasm of ascending colon: Secondary | ICD-10-CM | POA: Insufficient documentation

## 2019-06-21 DIAGNOSIS — D123 Benign neoplasm of transverse colon: Secondary | ICD-10-CM | POA: Insufficient documentation

## 2019-06-21 DIAGNOSIS — K621 Rectal polyp: Secondary | ICD-10-CM | POA: Diagnosis not present

## 2019-06-21 DIAGNOSIS — Z888 Allergy status to other drugs, medicaments and biological substances status: Secondary | ICD-10-CM | POA: Insufficient documentation

## 2019-06-21 DIAGNOSIS — Z85828 Personal history of other malignant neoplasm of skin: Secondary | ICD-10-CM | POA: Insufficient documentation

## 2019-06-21 HISTORY — PX: POLYPECTOMY: SHX5525

## 2019-06-21 HISTORY — DX: Nausea with vomiting, unspecified: R11.2

## 2019-06-21 HISTORY — PX: COLONOSCOPY WITH PROPOFOL: SHX5780

## 2019-06-21 HISTORY — DX: Other specified postprocedural states: Z98.890

## 2019-06-21 SURGERY — COLONOSCOPY WITH PROPOFOL
Anesthesia: General | Site: Rectum

## 2019-06-21 MED ORDER — STERILE WATER FOR IRRIGATION IR SOLN
Status: DC | PRN
  Administered 2019-06-21: 50 mL

## 2019-06-21 MED ORDER — LIDOCAINE HCL (CARDIAC) PF 100 MG/5ML IV SOSY
PREFILLED_SYRINGE | INTRAVENOUS | Status: DC | PRN
  Administered 2019-06-21: 50 mg via INTRAVENOUS

## 2019-06-21 MED ORDER — PROPOFOL 10 MG/ML IV BOLUS
INTRAVENOUS | Status: DC | PRN
  Administered 2019-06-21: 50 mg via INTRAVENOUS
  Administered 2019-06-21: 30 mg via INTRAVENOUS
  Administered 2019-06-21: 50 mg via INTRAVENOUS
  Administered 2019-06-21: 100 mg via INTRAVENOUS
  Administered 2019-06-21: 40 mg via INTRAVENOUS
  Administered 2019-06-21: 30 mg via INTRAVENOUS

## 2019-06-21 MED ORDER — LACTATED RINGERS IV SOLN
100.0000 mL/h | INTRAVENOUS | Status: DC
  Administered 2019-06-21: 100 mL/h via INTRAVENOUS

## 2019-06-21 MED ORDER — ONDANSETRON HCL 4 MG/2ML IJ SOLN: 4.0000 mg | Freq: Once | INTRAMUSCULAR | Status: DC | PRN

## 2019-06-21 MED ORDER — ACETAMINOPHEN 10 MG/ML IV SOLN: 1000.0000 mg | Freq: Once | INTRAVENOUS | Status: DC | PRN

## 2019-06-21 MED ORDER — SODIUM CHLORIDE 0.9 % IV SOLN: INTRAVENOUS | Status: DC

## 2019-06-21 SURGICAL SUPPLY — 6 items
CANISTER SUCT 1200ML W/VALVE (MISCELLANEOUS) ×2 IMPLANT
FORCEPS BIOP RAD 4 LRG CAP 4 (CUTTING FORCEPS) ×2 IMPLANT
GOWN CVR UNV OPN BCK APRN NK (MISCELLANEOUS) ×2 IMPLANT
GOWN ISOL THUMB LOOP REG UNIV (MISCELLANEOUS) ×2
KIT ENDO PROCEDURE OLY (KITS) ×2 IMPLANT
WATER STERILE IRR 250ML POUR (IV SOLUTION) ×2 IMPLANT

## 2019-06-21 NOTE — Transfer of Care (Signed)
Immediate Anesthesia Transfer of Care Note  Patient: Robin Charles  Procedure(s) Performed: COLONOSCOPY WITH BIOPSY (N/A Rectum) POLYPECTOMY (N/A Rectum)  Patient Location: PACU  Anesthesia Type: General  Level of Consciousness: awake, alert  and patient cooperative  Airway and Oxygen Therapy: Patient Spontanous Breathing and Patient connected to supplemental oxygen  Post-op Assessment: Post-op Vital signs reviewed, Patient's Cardiovascular Status Stable, Respiratory Function Stable, Patent Airway and No signs of Nausea or vomiting  Post-op Vital Signs: Reviewed and stable  Complications: No apparent anesthesia complications

## 2019-06-21 NOTE — Anesthesia Procedure Notes (Signed)
Procedure Name: General with mask airway Date/Time: 06/21/2019 10:39 AM Performed by: Cameron Ali, CRNA Pre-anesthesia Checklist: Timeout performed, Patient being monitored, Suction available, Emergency Drugs available and Patient identified Patient Re-evaluated:Patient Re-evaluated prior to induction Oxygen Delivery Method: Nasal cannula Induction Type: IV induction

## 2019-06-21 NOTE — H&P (Signed)
Lucilla Lame, MD [address] [place], [place] [address] Phone:[phone] Fax : [phone]  Primary Care Physician:  Valerie Roys, DO Primary Gastroenterologist:  Dr. Allen Norris  Pre-Procedure History & Physical: HPI:  Robin Charles is a 63 y.o. female is here for an colonoscopy.   Past Medical History:  Diagnosis Date  . Abnormal mammogram   . Cancer ([place])    skin ca  . Family history of colon cancer in mother   . Hematuria 05/15/2015  . Hypercalcemia   . Hypertension    resolved after stopping BCP  . Pelvic mass in female 05/15/2015  . PONV (postoperative nausea and vomiting)    slow to wake after 1st colonoscopy  . Postmenopausal atrophic vaginitis   . Postmenopausal bleeding 05/15/2015    Past Surgical History:  Procedure Laterality Date  . COLONOSCOPY WITH PROPOFOL N/A 09/25/2015   Procedure: COLONOSCOPY WITH PROPOFOL;  Surgeon: Manya Silvas, MD;  Location: [facility] ENDOSCOPY;  Service: Endoscopy;  Laterality: N/A;  . RECTAL POLYPECTOMY  11/2015   [facility]    Prior to Admission medications   Medication Sig Start Date End Date Taking? Authorizing Provider  aspirin EC 81 MG tablet Take 81 mg by mouth daily.   Yes [provider]  cetirizine (ZYRTEC) 10 MG tablet Take 10 mg by mouth daily.   Yes [provider]  docusate sodium (COLACE) 100 MG capsule Take 100 mg by mouth 2 (two) times daily as needed for mild constipation.   Yes [provider]  ibuprofen (ADVIL,MOTRIN) 200 MG tablet Take by mouth.   Yes [provider]  Multiple Vitamin (MULTIVITAMIN) tablet Take 1 tablet by mouth daily.   Yes [provider]    Allergies as of 06/06/2019 - Review Complete 12/08/2017  Allergen Reaction Noted  . Propoxyphene  11/06/2015    Family History  Problem Relation Age of Onset  . Colon cancer Mother 65  . Cancer Mother     Social History   Socioeconomic History  . Marital status: Married    Spouse name:  Not on file  . Number of children: Not on file  . Years of education: Not on file  . Highest education level: Not on file  Occupational History  . Not on file  Social Needs  . Financial resource strain: Not on file  . Food insecurity    Worry: Not on file    Inability: Not on file  . Transportation needs    Medical: Not on file    Non-medical: Not on file  Tobacco Use  . Smoking status: Never Smoker  . Smokeless tobacco: Never Used  Substance and Sexual Activity  . Alcohol use: No    Comment: rare  . Drug use: No  . Sexual activity: Yes    Birth control/protection: Post-menopausal  Lifestyle  . Physical activity    Days per week: Not on file    Minutes per session: Not on file  . Stress: Not on file  Relationships  . Social Herbalist on phone: Not on file    Gets together: Not on file    Attends religious service: Not on file    Active member of club or organization: Not on file    Attends meetings of clubs or organizations: Not on file    Relationship status: Not on file  . Intimate partner violence    Fear of current or ex partner: Not on file  Emotionally abused: Not on file    Physically abused: Not on file    Forced sexual activity: Not on file  Other Topics Concern  . Not on file  Social History Narrative  . Not on file    Review of Systems: See HPI, otherwise negative ROS  Physical Exam: BP 123/66   Pulse 72   Temp 97.9 F (36.6 C) (Temporal)   Ht 5\' 1"  (1.549 m)   Wt 54.9 kg   SpO2 100%   BMI 22.86 kg/m  General:   Alert,  pleasant and cooperative in NAD Head:  Normocephalic and atraumatic. Neck:  Supple; no masses or thyromegaly. Lungs:  Clear throughout to auscultation.    Heart:  Regular rate and rhythm. Abdomen:  Soft, nontender and nondistended. Normal bowel sounds, without guarding, and without rebound.   Neurologic:  Alert and  oriented x4;  grossly normal neurologically.  Impression/Plan: Robin Charles is here for  an colonoscopy to be performed for adenoma 09/29/2015  Risks, benefits, limitations, and alternatives regarding  colonoscopy have been reviewed with the patient.  Questions have been answered.  All parties agreeable.   Lucilla Lame, MD  06/21/2019, 9:51 AM

## 2019-06-21 NOTE — Op Note (Signed)
[facility] Gastroenterology Patient Name: Robin Charles Procedure Date: 06/21/2019 10:30 AM MRN: [id number] Account #: [id number] Date of Birth: 11/04/1955 Admit Type: Outpatient Age: 63 Room: [facility] OR ROOM 01 Gender: Female Note Status: Finalized Procedure:            Colonoscopy Indications:          High risk colon cancer surveillance: Personal history                        of colonic polyps Providers:            Lucilla Lame MD, MD Referring MD:         Valerie Roys (Referring MD) Medicines:            Propofol per Anesthesia Complications:        No immediate complications. Procedure:            Pre-Anesthesia Assessment:                       - Prior to the procedure, a History and Physical was                        performed, and patient medications and allergies were                        reviewed. The patient's tolerance of previous                        anesthesia was also reviewed. The risks and benefits of                        the procedure and the sedation options and risks were                        discussed with the patient. All questions were                        answered, and informed consent was obtained. Prior                        Anticoagulants: The patient has taken no previous                        anticoagulant or antiplatelet agents. ASA Grade                        Assessment: II - A patient with mild systemic disease.                        After reviewing the risks and benefits, the patient was                        deemed in satisfactory condition to undergo the                        procedure.                       After obtaining informed consent, the colonoscope was  passed under direct vision. Throughout the procedure,                        the patient's blood pressure, pulse, and oxygen                        saturations were monitored continuously. The   Colonoscope was introduced through the anus and                        advanced to the the cecum, identified by appendiceal                        orifice and ileocecal valve. The colonoscopy was                        performed without difficulty. The patient tolerated the                        procedure well. The quality of the bowel preparation                        was excellent. Findings:      The perianal and digital rectal examinations were normal.      A localized area of mildly nodular mucosa was found in the rectum.       Biopsies were taken with a cold forceps for histology.      A scar was found in the rectum.      Two sessile polyps were found in the ascending colon. The polyps were 2       to 3 mm in size. These polyps were removed with a cold biopsy forceps.       Resection and retrieval were complete.      A 2 mm polyp was found in the transverse colon. The polyp was sessile.       The polyp was removed with a cold biopsy forceps. Resection and       retrieval were complete. Impression:           - Nodular mucosa in the rectum. Biopsied.                       - Scar in the rectum.                       - Two 2 to 3 mm polyps in the ascending colon, removed                        with a cold biopsy forceps. Resected and retrieved.                       - One 2 mm polyp in the transverse colon, removed with                        a cold biopsy forceps. Resected and retrieved. Recommendation:       - Discharge patient to home.                       - Resume previous diet.                       -  Continue present medications.                       - Await pathology results.                       - Repeat colonoscopy in 5 years for surveillance. Procedure Code(s):    --- Professional ---                       [phone], Colonoscopy, flexible; with biopsy, single or                        multiple Diagnosis Code(s):    --- Professional ---                       Z86.010,  Personal history of colonic polyps                       K63.5, Polyp of colon                       K62.89, Other specified diseases of anus and rectum CPT copyright 2019 American Medical Association. All rights reserved. The codes documented in this report are preliminary and upon coder review may  be revised to meet current compliance requirements. Lucilla Lame MD, MD 06/21/2019 10:54:24 AM This report has been signed electronically. Number of Addenda: 0 Note Initiated On: 06/21/2019 10:30 AM Scope Withdrawal Time: 0 hours 8 minutes 17 seconds  Total Procedure Duration: 0 hours 13 minutes 54 seconds  Estimated Blood Loss: Estimated blood loss: none.      [facility]

## 2019-06-21 NOTE — Anesthesia Postprocedure Evaluation (Signed)
Anesthesia Post Note  Patient: Robin Charles  Procedure(s) Performed: COLONOSCOPY WITH BIOPSY (N/A Rectum) POLYPECTOMY (N/A Rectum)  Patient location during evaluation: PACU Anesthesia Type: General Level of consciousness: awake and alert Pain management: pain level controlled Vital Signs Assessment: post-procedure vital signs reviewed and stable Respiratory status: spontaneous breathing, nonlabored ventilation, respiratory function stable and patient connected to nasal cannula oxygen Cardiovascular status: blood pressure returned to baseline and stable Postop Assessment: no apparent nausea or vomiting Anesthetic complications: no    Deiondra Denley A  Nidhi Jacome

## 2019-06-22 ENCOUNTER — Encounter: Payer: Self-pay | Admitting: Gastroenterology

## 2019-06-25 ENCOUNTER — Encounter: Payer: Self-pay | Admitting: Gastroenterology

## 2019-06-26 ENCOUNTER — Encounter: Payer: Self-pay | Admitting: Gastroenterology

## 2019-07-05 ENCOUNTER — Encounter: Payer: BLUE CROSS/BLUE SHIELD | Admitting: Family Medicine

## 2019-08-09 DIAGNOSIS — H524 Presbyopia: Secondary | ICD-10-CM | POA: Diagnosis not present

## 2019-08-24 DIAGNOSIS — Z20828 Contact with and (suspected) exposure to other viral communicable diseases: Secondary | ICD-10-CM | POA: Diagnosis not present

## 2019-09-07 DIAGNOSIS — Z20822 Contact with and (suspected) exposure to covid-19: Secondary | ICD-10-CM | POA: Diagnosis not present

## 2020-06-24 ENCOUNTER — Ambulatory Visit (INDEPENDENT_AMBULATORY_CARE_PROVIDER_SITE_OTHER): Payer: BC Managed Care – PPO | Admitting: Family Medicine

## 2020-06-24 ENCOUNTER — Encounter: Payer: Self-pay | Admitting: Family Medicine

## 2020-06-24 ENCOUNTER — Other Ambulatory Visit: Payer: Self-pay

## 2020-06-24 VITALS — BP 138/73 | HR 63 | Temp 98.6°F | Ht 61.0 in | Wt 124.0 lb

## 2020-06-24 DIAGNOSIS — R8281 Pyuria: Secondary | ICD-10-CM | POA: Diagnosis not present

## 2020-06-24 DIAGNOSIS — Z136 Encounter for screening for cardiovascular disorders: Secondary | ICD-10-CM | POA: Diagnosis not present

## 2020-06-24 DIAGNOSIS — Z1231 Encounter for screening mammogram for malignant neoplasm of breast: Secondary | ICD-10-CM

## 2020-06-24 DIAGNOSIS — Z0000 Encounter for general adult medical examination without abnormal findings: Secondary | ICD-10-CM | POA: Diagnosis not present

## 2020-06-24 LAB — MICROSCOPIC EXAMINATION: Bacteria, UA: NONE SEEN

## 2020-06-24 LAB — URINALYSIS, ROUTINE W REFLEX MICROSCOPIC
Bilirubin, UA: NEGATIVE
Glucose, UA: NEGATIVE
Ketones, UA: NEGATIVE
Nitrite, UA: NEGATIVE
Protein,UA: NEGATIVE
Specific Gravity, UA: 1.01 (ref 1.005–1.030)
Urobilinogen, Ur: 0.2 mg/dL (ref 0.2–1.0)
pH, UA: 7 (ref 5.0–7.5)

## 2020-06-24 NOTE — Patient Instructions (Addendum)
Call to schedule your mammogram  [facility]  Address: [address], [place], [place] [address]  Phone: [phone]   Health Maintenance, Female Adopting a healthy lifestyle and getting preventive care are important in promoting health and wellness. Ask your health care provider about:  The right schedule for you to have regular tests and exams.  Things you can do on your own to prevent diseases and keep yourself healthy. What should I know about diet, weight, and exercise? Eat a healthy diet   Eat a diet that includes plenty of vegetables, fruits, low-fat dairy products, and lean protein.  Do not eat a lot of foods that are high in solid fats, added sugars, or sodium. Maintain a healthy weight Body mass index (BMI) is used to identify weight problems. It estimates body fat based on height and weight. Your health care provider can help determine your BMI and help you achieve or maintain a healthy weight. Get regular exercise Get regular exercise. This is one of the most important things you can do for your health. Most adults should:  Exercise for at least 150 minutes each week. The exercise should increase your heart rate and make you sweat (moderate-intensity exercise).  Do strengthening exercises at least twice a week. This is in addition to the moderate-intensity exercise.  Spend less time sitting. Even light physical activity can be beneficial. Watch cholesterol and blood lipids Have your blood tested for lipids and cholesterol at 64 years of age, then have this test every 5 years. Have your cholesterol levels checked more often if:  Your lipid or cholesterol levels are high.  You are older than 64 years of age.  You are at high risk for heart disease. What should I know about cancer screening? Depending on your health history and family history, you may need to have cancer screening at various ages. This may include screening  for:  Breast cancer.  Cervical cancer.  Colorectal cancer.  Skin cancer.  Lung cancer. What should I know about heart disease, diabetes, and high blood pressure? Blood pressure and heart disease  High blood pressure causes heart disease and increases the risk of stroke. This is more likely to develop in people who have high blood pressure readings, are of African descent, or are overweight.  Have your blood pressure checked: ? Every 3-5 years if you are 18-39 years of age. ? Every year if you are 40 years old or older. Diabetes Have regular diabetes screenings. This checks your fasting blood sugar level. Have the screening done:  Once every three years after age 40 if you are at a normal weight and have a low risk for diabetes.  More often and at a younger age if you are overweight or have a high risk for diabetes. What should I know about preventing infection? Hepatitis B If you have a higher risk for hepatitis B, you should be screened for this virus. Talk with your health care provider to find out if you are at risk for hepatitis B infection. Hepatitis C Testing is recommended for:  Everyone born from 1945 through 1965.  Anyone with known risk factors for hepatitis C. Sexually transmitted infections (STIs)  Get screened for STIs, including gonorrhea and chlamydia, if: ? You are sexually active and are younger than 64 years of age. ? You are older than 64 years of age and your health care provider tells you that you are at risk for this type of   infection. ? Your sexual activity has changed since you were last screened, and you are at increased risk for chlamydia or gonorrhea. Ask your health care provider if you are at risk.  Ask your health care provider about whether you are at high risk for HIV. Your health care provider may recommend a prescription medicine to help prevent HIV infection. If you choose to take medicine to prevent HIV, you should first get tested for HIV.  You should then be tested every 3 months for as long as you are taking the medicine. Pregnancy  If you are about to stop having your period (premenopausal) and you may become pregnant, seek counseling before you get pregnant.  Take 400 to 800 micrograms (mcg) of folic acid every day if you become pregnant.  Ask for birth control (contraception) if you want to prevent pregnancy. Osteoporosis and menopause Osteoporosis is a disease in which the bones lose minerals and strength with aging. This can result in bone fractures. If you are 65 years old or older, or if you are at risk for osteoporosis and fractures, ask your health care provider if you should:  Be screened for bone loss.  Take a calcium or vitamin D supplement to lower your risk of fractures.  Be given hormone replacement therapy (HRT) to treat symptoms of menopause. Follow these instructions at home: Lifestyle  Do not use any products that contain nicotine or tobacco, such as cigarettes, e-cigarettes, and chewing tobacco. If you need help quitting, ask your health care provider.  Do not use street drugs.  Do not share needles.  Ask your health care provider for help if you need support or information about quitting drugs. Alcohol use  Do not drink alcohol if: ? Your health care provider tells you not to drink. ? You are pregnant, may be pregnant, or are planning to become pregnant.  If you drink alcohol: ? Limit how much you use to 0-1 drink a day. ? Limit intake if you are breastfeeding.  Be aware of how much alcohol is in your drink. In the [place] one drink equals one 12 oz bottle of beer (355 mL), one 5 oz glass of wine (148 mL), or one 1 oz glass of hard liquor (44 mL). General instructions  Schedule regular health, dental, and eye exams.  Stay current with your vaccines.  Tell your health care provider if: ? You often feel depressed. ? You have ever been abused or do not feel safe at  home. Summary  Adopting a healthy lifestyle and getting preventive care are important in promoting health and wellness.  Follow your health care provider's instructions about healthy diet, exercising, and getting tested or screened for diseases.  Follow your health care provider's instructions on monitoring your cholesterol and blood pressure. This information is not intended to replace advice given to you by your health care provider. Make sure you discuss any questions you have with your health care provider. Document Revised: 08/16/2018 Document Reviewed: 08/16/2018 Elsevier Patient Education  2020 Elsevier Inc.  

## 2020-06-24 NOTE — Addendum Note (Signed)
Addended by: Valerie Roys on: 06/24/2020 04:47 PM   Modules accepted: Orders

## 2020-06-24 NOTE — Progress Notes (Signed)
BP 138/73   Pulse 63   Temp 98.6 F (37 C) (Oral)   Ht 5\' 1"  (1.549 m)   Wt 124 lb (56.2 kg)   SpO2 98%   BMI 23.43 kg/m    Subjective:    Patient ID: Robin Charles, female    DOB: [birth year], 64 y.o.   MRN: [id number]  HPI: Robin Charles is a 64 y.o. female presenting on 06/24/2020 for comprehensive medical examination. Current medical complaints include:none  She currently lives with: husband Menopausal Symptoms: no  Depression Screen done today and results listed below:  Depression screen [facility] 2/9 06/24/2020 12/08/2017 11/12/2016  Decreased Interest 0 0 0  Down, Depressed, Hopeless 0 0 0  PHQ - 2 Score 0 0 0  Altered sleeping 0 - -  Tired, decreased energy 0 - -  Change in appetite 0 - -  Feeling bad or failure about yourself  0 - -  Trouble concentrating 0 - -  Moving slowly or fidgety/restless 0 - -  Suicidal thoughts 0 - -  PHQ-9 Score 0 - -  Difficult doing work/chores Not difficult at all - -     Past Medical History:  Past Medical History:  Diagnosis Date  . Abnormal mammogram   . Cancer ([place])    skin ca  . Family history of colon cancer in mother   . Hematuria 05/15/2015  . Hypercalcemia   . Hypertension    resolved after stopping BCP  . Pelvic mass in female 05/15/2015  . PONV (postoperative nausea and vomiting)    slow to wake after 1st colonoscopy  . Postmenopausal atrophic vaginitis   . Postmenopausal bleeding 05/15/2015    Surgical History:  Past Surgical History:  Procedure Laterality Date  . COLONOSCOPY WITH PROPOFOL N/A 09/25/2015   Procedure: COLONOSCOPY WITH PROPOFOL;  Surgeon: Manya Silvas, MD;  Location: [facility] ENDOSCOPY;  Service: Endoscopy;  Laterality: N/A;  . COLONOSCOPY WITH PROPOFOL N/A 06/21/2019   Procedure: COLONOSCOPY WITH BIOPSY;  Surgeon: Lucilla Lame, MD;  Location: [facility];  Service: Endoscopy;  Laterality: N/A;  . POLYPECTOMY N/A 06/21/2019   Procedure: POLYPECTOMY;  Surgeon: Lucilla Lame, MD;  Location:  [facility];  Service: Endoscopy;  Laterality: N/A;  . RECTAL POLYPECTOMY  11/2015   [facility]    Medications:  Current Outpatient Medications on File Prior to Visit  Medication Sig  . aspirin EC 81 MG tablet Take 81 mg by mouth daily.  . cetirizine (ZYRTEC) 10 MG tablet Take 10 mg by mouth daily.  Marland Kitchen ibuprofen (ADVIL,MOTRIN) 200 MG tablet Take by mouth.  . Multiple Vitamin (MULTIVITAMIN) tablet Take 1 tablet by mouth daily.   No current facility-administered medications on file prior to visit.    Allergies:  Allergies  Allergen Reactions  . Propoxyphene Nausea And Vomiting  . Adhesive [Tape] Rash    Band-aids    Social History:  Social History   Socioeconomic History  . Marital status: Married    Spouse name: Not on file  . Number of children: Not on file  . Years of education: Not on file  . Highest education level: Not on file  Occupational History  . Not on file  Tobacco Use  . Smoking status: Never Smoker  . Smokeless tobacco: Never Used  Vaping Use  . Vaping Use: Never used  Substance and Sexual Activity  . Alcohol use: No    Comment: rare  . Drug use: No  . Sexual activity: Yes  Birth control/protection: Post-menopausal  Other Topics Concern  . Not on file  Social History Narrative  . Not on file   Social Determinants of Health   Financial Resource Strain:   . Difficulty of Paying Living Expenses: Not on file  Food Insecurity:   . Worried About Charity fundraiser in the Last Year: Not on file  . Ran Out of Food in the Last Year: Not on file  Transportation Needs:   . Lack of Transportation (Medical): Not on file  . Lack of Transportation (Non-Medical): Not on file  Physical Activity:   . Days of Exercise per Week: Not on file  . Minutes of Exercise per Session: Not on file  Stress:   . Feeling of Stress : Not on file  Social Connections:   . Frequency of Communication with Friends and Family: Not on file  . Frequency of Social Gatherings  with Friends and Family: Not on file  . Attends Religious Services: Not on file  . Active Member of Clubs or Organizations: Not on file  . Attends Archivist Meetings: Not on file  . Marital Status: Not on file  Intimate Partner Violence:   . Fear of Current or Ex-Partner: Not on file  . Emotionally Abused: Not on file  . Physically Abused: Not on file  . Sexually Abused: Not on file   Social History   Tobacco Use  Smoking Status Never Smoker  Smokeless Tobacco Never Used   Social History   Substance and Sexual Activity  Alcohol Use No   Comment: rare    Family History:  Family History  Problem Relation Age of Onset  . Colon cancer Mother 31  . Cancer Mother     Past medical history, surgical history, medications, allergies, family history and social history reviewed with patient today and changes made to appropriate areas of the chart.   Review of Systems  Constitutional: Negative.   HENT: Negative.   Eyes: Negative.   Respiratory: Negative.   Cardiovascular: Negative.   Gastrointestinal: Negative.   Genitourinary: Negative.   Musculoskeletal: Negative.   Skin: Negative.   Neurological: Negative.   Endo/Heme/Allergies: Positive for environmental allergies. Negative for polydipsia. Does not bruise/bleed easily.  Psychiatric/Behavioral: Negative.     All other ROS negative except what is listed above and in the HPI.      Objective:    BP 138/73   Pulse 63   Temp 98.6 F (37 C) (Oral)   Ht 5\' 1"  (1.549 m)   Wt 124 lb (56.2 kg)   SpO2 98%   BMI 23.43 kg/m   Wt Readings from Last 3 Encounters:  06/24/20 124 lb (56.2 kg)  06/21/19 121 lb (54.9 kg)  12/08/17 122 lb (55.3 kg)    Physical Exam Vitals and nursing note reviewed.  Constitutional:      General: She is not in acute distress.    Appearance: Normal appearance. She is not ill-appearing, toxic-appearing or diaphoretic.  HENT:     Head: Normocephalic and atraumatic.     Right Ear:  Tympanic membrane, ear canal and external ear normal. There is no impacted cerumen.     Left Ear: Tympanic membrane, ear canal and external ear normal. There is no impacted cerumen.     Nose: Nose normal. No congestion or rhinorrhea.     Mouth/Throat:     Mouth: Mucous membranes are moist.     Pharynx: Oropharynx is clear. No oropharyngeal exudate or posterior oropharyngeal erythema.  Eyes:     General: No scleral icterus.       Right eye: No discharge.        Left eye: No discharge.     Extraocular Movements: Extraocular movements intact.     Conjunctiva/sclera: Conjunctivae normal.     Pupils: Pupils are equal, round, and reactive to light.  Neck:     Vascular: No carotid bruit.  Cardiovascular:     Rate and Rhythm: Normal rate and regular rhythm.     Pulses: Normal pulses.     Heart sounds: No murmur heard.  No friction rub. No gallop.   Pulmonary:     Effort: Pulmonary effort is normal. No respiratory distress.     Breath sounds: Normal breath sounds. No stridor. No wheezing, rhonchi or rales.  Chest:     Chest wall: No tenderness.     Breasts:        Right: Normal. No swelling, bleeding, inverted nipple, mass, nipple discharge, skin change or tenderness.        Left: Normal. No swelling, bleeding, inverted nipple, mass, nipple discharge, skin change or tenderness.  Abdominal:     General: Abdomen is flat. Bowel sounds are normal. There is no distension.     Palpations: Abdomen is soft. There is no mass.     Tenderness: There is no abdominal tenderness. There is no right CVA tenderness, left CVA tenderness, guarding or rebound.     Hernia: No hernia is present.  Genitourinary:    Comments: Pelvic exams deferred with shared decision making Musculoskeletal:        General: No swelling, tenderness, deformity or signs of injury.     Cervical back: Normal range of motion and neck supple. No rigidity. No muscular tenderness.     Right lower leg: No edema.     Left lower leg: No  edema.  Lymphadenopathy:     Cervical: No cervical adenopathy.  Skin:    General: Skin is warm and dry.     Capillary Refill: Capillary refill takes less than 2 seconds.     Coloration: Skin is not jaundiced or pale.     Findings: No bruising, erythema, lesion or rash.  Neurological:     General: No focal deficit present.     Mental Status: She is alert and oriented to person, place, and time. Mental status is at baseline.     Cranial Nerves: No cranial nerve deficit.     Sensory: No sensory deficit.     Motor: No weakness.     Coordination: Coordination normal.     Gait: Gait normal.     Deep Tendon Reflexes: Reflexes normal.  Psychiatric:        Mood and Affect: Mood normal.        Behavior: Behavior normal.        Thought Content: Thought content normal.        Judgment: Judgment normal.     Results for orders placed or performed during the hospital encounter of 06/18/19  SARS CORONAVIRUS 2 (TAT 6-24 HRS) Nasopharyngeal Nasopharyngeal Swab   Specimen: Nasopharyngeal Swab  Result Value Ref Range   SARS Coronavirus 2 NEGATIVE NEGATIVE      Assessment & Plan:   Problem List Items Addressed This Visit    None    Visit Diagnoses    Routine general medical examination at a health care facility    -  Primary   Vaccines up to date. Screening labs checked today. Pap  up to date. Mammogram ordered. Colonoscopy up to date. Continue diet and exercise. Call with concerns.    Relevant Orders   CBC with Differential/Platelet   Comprehensive metabolic panel   Lipid Panel w/o Chol/HDL Ratio   TSH   Urinalysis, Routine w reflex microscopic   Encounter for screening mammogram for malignant neoplasm of breast       Mammogram ordered today.   Relevant Orders   MM 3D SCREEN BREAST BILATERAL       Follow up plan: Return in about 1 year (around 06/24/2021) for physical.   LABORATORY TESTING:  - Pap smear: up to date  IMMUNIZATIONS:   - Tdap: Tetanus vaccination status reviewed:  last tetanus booster within 10 years. - Influenza: Given elsewhere - Pneumovax: Not applicable - Prevnar: Not applicable - COVID: Up to date  SCREENING: -Mammogram: Ordered today  - Colonoscopy: Up to date   PATIENT COUNSELING:   Advised to take 1 mg of folate supplement per day if capable of pregnancy.   Sexuality: Discussed sexually transmitted diseases, partner selection, use of condoms, avoidance of unintended pregnancy  and contraceptive alternatives.   Advised to avoid cigarette smoking.  I discussed with the patient that most people either abstain from alcohol or drink within safe limits (<=14/week and <=4 drinks/occasion for males, <=7/weeks and <= 3 drinks/occasion for females) and that the risk for alcohol disorders and other health effects rises proportionally with the number of drinks per week and how often a drinker exceeds daily limits.  Discussed cessation/primary prevention of drug use and availability of treatment for abuse.   Diet: Encouraged to adjust caloric intake to maintain  or achieve ideal body weight, to reduce intake of dietary saturated fat and total fat, to limit sodium intake by avoiding high sodium foods and not adding table salt, and to maintain adequate dietary potassium and calcium preferably from fresh fruits, vegetables, and low-fat dairy products.    stressed the importance of regular exercise  Injury prevention: Discussed safety belts, safety helmets, smoke detector, smoking near bedding or upholstery.   Dental health: Discussed importance of regular tooth brushing, flossing, and dental visits.    NEXT PREVENTATIVE PHYSICAL DUE IN 1 YEAR. Return in about 1 year (around 06/24/2021) for physical.

## 2020-06-25 ENCOUNTER — Encounter: Payer: Self-pay | Admitting: Family Medicine

## 2020-06-25 LAB — COMPREHENSIVE METABOLIC PANEL
ALT: 16 IU/L (ref 0–32)
AST: 24 IU/L (ref 0–40)
Albumin/Globulin Ratio: 2 (ref 1.2–2.2)
Albumin: 4.8 g/dL (ref 3.8–4.8)
Alkaline Phosphatase: 118 IU/L (ref 44–121)
BUN/Creatinine Ratio: 19 (ref 12–28)
BUN: 13 mg/dL (ref 8–27)
Bilirubin Total: 0.4 mg/dL (ref 0.0–1.2)
CO2: 29 mmol/L (ref 20–29)
Calcium: 10.1 mg/dL (ref 8.7–10.3)
Chloride: 100 mmol/L (ref 96–106)
Creatinine, Ser: 0.7 mg/dL (ref 0.57–1.00)
GFR calc Af Amer: 106 mL/min/{1.73_m2} (ref >59)
GFR calc non Af Amer: 92 mL/min/{1.73_m2} (ref >59)
Globulin, Total: 2.4 g/dL (ref 1.5–4.5)
Glucose: 83 mg/dL (ref 65–99)
Potassium: 3.9 mmol/L (ref 3.5–5.2)
Sodium: 141 mmol/L (ref 134–144)
Total Protein: 7.2 g/dL (ref 6.0–8.5)

## 2020-06-25 LAB — LIPID PANEL W/O CHOL/HDL RATIO
Cholesterol, Total: 209 mg/dL — ABNORMAL HIGH (ref 100–199)
HDL: 76 mg/dL (ref >39)
LDL Chol Calc (NIH): 119 mg/dL — ABNORMAL HIGH (ref 0–99)
Triglycerides: 77 mg/dL (ref 0–149)
VLDL Cholesterol Cal: 14 mg/dL (ref 5–40)

## 2020-06-25 LAB — CBC WITH DIFFERENTIAL/PLATELET
Basophils Absolute: 0.1 10*3/uL (ref 0.0–0.2)
Basos: 1 %
EOS (ABSOLUTE): 0.1 10*3/uL (ref 0.0–0.4)
Eos: 1 %
Hematocrit: 38.7 % (ref 34.0–46.6)
Hemoglobin: 13 g/dL (ref 11.1–15.9)
Immature Grans (Abs): 0 10*3/uL (ref 0.0–0.1)
Immature Granulocytes: 0 %
Lymphocytes Absolute: 2.9 10*3/uL (ref 0.7–3.1)
Lymphs: 41 %
MCH: 31.3 pg (ref 26.6–33.0)
MCHC: 33.6 g/dL (ref 31.5–35.7)
MCV: 93 fL (ref 79–97)
Monocytes Absolute: 0.6 10*3/uL (ref 0.1–0.9)
Monocytes: 8 %
Neutrophils Absolute: 3.6 10*3/uL (ref 1.4–7.0)
Neutrophils: 49 %
Platelets: 269 10*3/uL (ref 150–450)
RBC: 4.16 x10E6/uL (ref 3.77–5.28)
RDW: 12.4 % (ref 11.7–15.4)
WBC: 7.2 10*3/uL (ref 3.4–10.8)

## 2020-06-25 LAB — TSH: TSH: 1.76 u[IU]/mL (ref 0.450–4.500)

## 2020-06-26 LAB — URINE CULTURE

## 2020-08-05 ENCOUNTER — Ambulatory Visit
Admission: RE | Admit: 2020-08-05 | Discharge: 2020-08-05 | Disposition: A | Discharge status: Home / Self Care | Payer: BC Managed Care – PPO | Source: Ambulatory Visit | Attending: Family Medicine | Admitting: Family Medicine

## 2020-08-05 ENCOUNTER — Other Ambulatory Visit: Payer: Self-pay

## 2020-08-05 DIAGNOSIS — Z1231 Encounter for screening mammogram for malignant neoplasm of breast: Secondary | ICD-10-CM | POA: Insufficient documentation

## 2020-08-11 ENCOUNTER — Encounter: Payer: Self-pay | Admitting: Family Medicine

## 2020-08-14 ENCOUNTER — Telehealth: Payer: Self-pay

## 2020-08-14 NOTE — Telephone Encounter (Signed)
Called pt and advised of how cpe and billing works. Pt states that she thought it was all part of cpe  Copied from [place] [phone]. Topic: General - Other >> Aug 12, 2020  2:28 PM Robin Charles wrote: Reason for CRM: pt called in for assistance. Pt says that she had her CPE on 06/24/20- pt says that she received a bill for a urinalysis. Pt says that it should have been covered under her cpe. Pt would like further assistance with this billing concern.    [phone] - Cell

## 2021-07-23 ENCOUNTER — Ambulatory Visit: Payer: Self-pay

## 2021-07-23 ENCOUNTER — Telehealth (INDEPENDENT_AMBULATORY_CARE_PROVIDER_SITE_OTHER): Payer: Medicare Other | Admitting: Nurse Practitioner

## 2021-07-23 ENCOUNTER — Encounter: Payer: Self-pay | Admitting: Nurse Practitioner

## 2021-07-23 ENCOUNTER — Telehealth: Payer: Self-pay | Admitting: Nurse Practitioner

## 2021-07-23 DIAGNOSIS — U071 COVID-19: Secondary | ICD-10-CM | POA: Diagnosis not present

## 2021-07-23 MED ORDER — MOLNUPIRAVIR EUA 200MG CAPSULE: 4.0000 | ORAL_CAPSULE | Freq: Two times a day (BID) | ORAL | 0 refills | Status: AC

## 2021-07-23 NOTE — Assessment & Plan Note (Signed)
Will treat with molnupiravir as last GFR greater than 1 year ago. Continue OTC treatments. Encouraged rest, fluids. Discussed isolation. Follow up if symptoms not improving or worsening.

## 2021-07-23 NOTE — Telephone Encounter (Signed)
Called and spoke to patient. She states that she husband is the pharmacist at [employer] and suggested sending in [place] instead of the mulnopirovir. She states that he is saying the paxlovid is more effective and that the mulnopirovir is used more in patient's with kidney problems.

## 2021-07-23 NOTE — Telephone Encounter (Signed)
Pt's husband, Yvone Neu, has requested for NP Lauren/clinical to contact him regarding pt's prescription for molnupiravir.  Pt does not have a DPR on file but husband is insistent on speaking with someone.  Please contact pt's husband at [phone] or pt at [phone].

## 2021-07-23 NOTE — Progress Notes (Signed)
Acute Office Visit  Subjective:    Patient ID: Robin Charles, female    DOB: 03-31-1956, 65 y.o.   MRN: [id number]  Chief Complaint  Patient presents with   Covid Positive    Pt states she started having a scratchy throat Monday night. States her throat started hurting more on Tuesday, coughing, ear pain, fever, headache, and body aches. States she took a home covid test that was positive.     HPI Patient is in today for sore throat, cough, fever, and headache that started Monday. She took 2 covid-19 tests at home, yesterday and today that were positive.   UPPER RESPIRATORY TRACT INFECTION  Worst symptom: sore throat Fever: yes Cough: yes Shortness of breath: no Wheezing: yes Chest pain: no Chest tightness: no Chest congestion: no Nasal congestion: yes Runny nose: yes Post nasal drip: no Sneezing: yes Sore throat: yes Swollen glands: no Sinus pressure: no Headache: yes Face pain: no Toothache: no Ear pain: no  Ear pressure: no  Eyes red/itching:no Eye drainage/crusting: no  Vomiting: no Rash: no Fatigue: yes Sick contacts: yes - nephew Strep contacts: no  Context: better Recurrent sinusitis: no Relief with OTC cold/cough medications: yes  Treatments attempted: ibuprofen, mucinex, cough syrup, chloraseptic throat spray.     Past Medical History:  Diagnosis Date   Abnormal mammogram    Cancer ([place])    skin ca   Family history of colon cancer in mother    Hematuria 05/15/2015   Hypercalcemia    Hypertension    resolved after stopping BCP   Pelvic mass in female 05/15/2015   PONV (postoperative nausea and vomiting)    slow to wake after 1st colonoscopy   Postmenopausal atrophic vaginitis    Postmenopausal bleeding 05/15/2015    Past Surgical History:  Procedure Laterality Date   COLONOSCOPY WITH PROPOFOL N/A 09/25/2015   Procedure: COLONOSCOPY WITH PROPOFOL;  Surgeon: Manya Silvas, MD;  Location: [facility];  Service: Endoscopy;  Laterality:  N/A;   COLONOSCOPY WITH PROPOFOL N/A 06/21/2019   Procedure: COLONOSCOPY WITH BIOPSY;  Surgeon: Lucilla Lame, MD;  Location: [facility];  Service: Endoscopy;  Laterality: N/A;   POLYPECTOMY N/A 06/21/2019   Procedure: POLYPECTOMY;  Surgeon: Lucilla Lame, MD;  Location: [facility];  Service: Endoscopy;  Laterality: N/A;   RECTAL POLYPECTOMY  11/2015   [facility]    Family History  Problem Relation Age of Onset   Colon cancer Mother 64   Cancer Mother     Social History   Socioeconomic History   Marital status: Married    Spouse name: Not on file   Number of children: Not on file   Years of education: Not on file   Highest education level: Not on file  Occupational History   Not on file  Tobacco Use   Smoking status: Never   Smokeless tobacco: Never  Vaping Use   Vaping Use: Never used  Substance and Sexual Activity   Alcohol use: No    Comment: rare   Drug use: No   Sexual activity: Yes    Birth control/protection: Post-menopausal  Other Topics Concern   Not on file  Social History Narrative   Not on file   Social Determinants of Health   Financial Resource Strain: Not on file  Food Insecurity: Not on file  Transportation Needs: Not on file  Physical Activity: Not on file  Stress: Not on file  Social Connections: Not on file  Intimate Partner Violence: Not  on file    Outpatient Medications Prior to Visit  Medication Sig Dispense Refill   aspirin EC 81 MG tablet Take 81 mg by mouth daily.     cetirizine (ZYRTEC) 10 MG tablet Take 10 mg by mouth daily.     ibuprofen (ADVIL,MOTRIN) 200 MG tablet Take by mouth.     Multiple Vitamin (MULTIVITAMIN) tablet Take 1 tablet by mouth daily.     No facility-administered medications prior to visit.    Allergies  Allergen Reactions   Propoxyphene Nausea And Vomiting   Adhesive [Tape] Rash    Band-aids    Review of Systems  Constitutional:  Positive for fatigue and fever.  HENT:  Positive for  congestion, postnasal drip, rhinorrhea, sneezing and sore throat. Negative for ear pain and sinus pressure.   Eyes: Negative.   Respiratory:  Positive for cough. Negative for shortness of breath.   Cardiovascular: Negative.   Gastrointestinal: Negative.   Endocrine: Negative.   Genitourinary: Negative.   Musculoskeletal:  Positive for myalgias.  Skin: Negative.   Neurological:  Positive for headaches. Negative for dizziness.  Psychiatric/Behavioral: Negative.        Objective:    Physical Exam Vitals and nursing note reviewed.  Pulmonary:     Effort: Pulmonary effort is normal.     Comments: Able to talk in complete sentences  Neurological:     Mental Status: She is oriented to person, place, and time.  Psychiatric:        Thought Content: Thought content normal.    There were no vitals taken for this visit. Wt Readings from Last 3 Encounters:  06/24/20 124 lb (56.2 kg)  06/21/19 121 lb (54.9 kg)  12/08/17 122 lb (55.3 kg)    Health Maintenance Due  Topic Date Due   COVID-19 Vaccine (3 - Booster for Pfizer series) 12/21/2019   Pneumonia Vaccine 21+ Years old (1 - PCV) Never done   DEXA SCAN  Never done   PAP SMEAR-Modifier  12/08/2020   INFLUENZA VACCINE  04/06/2021    There are no preventive care reminders to display for this patient.   Lab Results  Component Value Date   TSH 1.760 06/24/2020   Lab Results  Component Value Date   WBC 7.2 06/24/2020   HGB 13.0 06/24/2020   HCT 38.7 06/24/2020   MCV 93 06/24/2020   PLT 269 06/24/2020   Lab Results  Component Value Date   NA 141 06/24/2020   K 3.9 06/24/2020   CO2 29 06/24/2020   GLUCOSE 83 06/24/2020   BUN 13 06/24/2020   CREATININE 0.70 06/24/2020   BILITOT 0.4 06/24/2020   ALKPHOS 118 06/24/2020   AST 24 06/24/2020   ALT 16 06/24/2020   PROT 7.2 06/24/2020   ALBUMIN 4.8 06/24/2020   CALCIUM 10.1 06/24/2020   Lab Results  Component Value Date   CHOL 209 (H) 06/24/2020   Lab Results   Component Value Date   HDL 76 06/24/2020   Lab Results  Component Value Date   LDLCALC 119 (H) 06/24/2020   Lab Results  Component Value Date   TRIG 77 06/24/2020   No results found for: CHOLHDL No results found for: HGBA1C     Assessment & Plan:   Problem List Items Addressed This Visit       Other   COVID-19 - Primary    Will treat with molnupiravir as last GFR greater than 1 year ago. Continue OTC treatments. Encouraged rest, fluids. Discussed isolation. Follow up  if symptoms not improving or worsening.       Relevant Medications   molnupiravir EUA (LAGEVRIO) 200 mg CAPS capsule     Meds ordered this encounter  Medications   molnupiravir EUA (LAGEVRIO) 200 mg CAPS capsule    Sig: Take 4 capsules (800 mg total) by mouth 2 (two) times daily for 5 days.    Dispense:  40 capsule    Refill:  0    This visit was completed via telephone due to the restrictions of the COVID-19 pandemic. All issues as above were discussed and addressed but no physical exam was performed. If it was felt that the patient should be evaluated in the office, they were directed there. The patient verbally consented to this visit. Patient was unable to complete an audio/visual visit due to Technical difficulties", "Lack of internet. Due to the catastrophic nature of the COVID-19 pandemic, this visit was done through audio contact only. Location of the patient: home Location of the provider: work Those involved with this call:  Provider: Vance Peper, NP CMA:  Donato Schultz, student CMA Front Desk/Registration: Myrlene Broker  Time spent on call:  10 minutes on the phone discussing health concerns. 10 minutes total spent in review of patient's record and preparation of their chart.   Charyl Dancer, NP

## 2021-07-23 NOTE — Telephone Encounter (Signed)
If she wants to come in and have her kidney function drawn, I can order paxlovid tomorrow. Or she can have molnupiravir today. If she wants to come in, I will draw her labs in the car. Let me know.

## 2021-07-23 NOTE — Telephone Encounter (Signed)
Called and notified patient of options per Lauren. Patient states she will call us back to let us know what she would like to do.

## 2021-07-23 NOTE — Telephone Encounter (Signed)
Appointment scheduled with NP Lauren.

## 2021-07-23 NOTE — Telephone Encounter (Signed)
Pt. Reports she tested positive for COVID 19 yesterday. Has cough, headache, sore throat, ear pain, fever. Asking for Paxlovid. Warm transfer to [facility] in the practice for appointment.    Reason for Disposition  MILD difficulty breathing (e.g., minimal/no SOB at rest, SOB with walking, pulse <100)  Answer Assessment - Initial Assessment Questions 1. COVID-19 DIAGNOSIS: "Who made your COVID-19 diagnosis?" "Was it confirmed by a positive lab test or self-test?" If not diagnosed by a doctor (or NP/PA), ask "Are there lots of cases (community spread) where you live?" Note: See public health department website, if unsure.     Home test 2. COVID-19 EXPOSURE: "Was there any known exposure to COVID before the symptoms began?" CDC Definition of close contact: within 6 feet (2 meters) for a total of 15 minutes or more over a 24-hour period.      Yes 3. ONSET: "When did the COVID-19 symptoms start?"      Yesterday 4. WORST SYMPTOM: "What is your worst symptom?" (e.g., cough, fever, shortness of breath, muscle aches)     Sore throat, headache 5. COUGH: "Do you have a cough?" If Yes, ask: "How bad is the cough?"       Mild 6. FEVER: "Do you have a fever?" If Yes, ask: "What is your temperature, how was it measured, and when did it start?"     99 7. RESPIRATORY STATUS: "Describe your breathing?" (e.g., shortness of breath, wheezing, unable to speak)      No 8. BETTER-SAME-WORSE: "Are you getting better, staying the same or getting worse compared to yesterday?"  If getting worse, ask, "In what way?"     Worse 9. HIGH RISK DISEASE: "Do you have any chronic medical problems?" (e.g., asthma, heart or lung disease, weak immune system, obesity, etc.)     No 10. VACCINE: "Have you had the COVID-19 vaccine?" If Yes, ask: "Which one, how many shots, when did you get it?"       Yes 11. BOOSTER: "Have you received your COVID-19 booster?" If Yes, ask: "Which one and when did you get it?"       Yes 12. PREGNANCY:  "Is there any chance you are pregnant?" "When was your last menstrual period?"       No 13. OTHER SYMPTOMS: "Do you have any other symptoms?"  (e.g., chills, fatigue, headache, loss of smell or taste, muscle pain, sore throat)       Headache, right ear pain 14. O2 SATURATION MONITOR:  "Do you use an oxygen saturation monitor (pulse oximeter) at home?" If Yes, ask "What is your reading (oxygen level) today?" "What is your usual oxygen saturation reading?" (e.g., 95%)       No  Protocols used: Coronavirus (COVID-19) Diagnosed or Suspected-A-AH

## 2021-07-24 NOTE — Progress Notes (Signed)
Lmom asking pt to call back to schedule a physical.

## 2021-07-29 NOTE — Progress Notes (Signed)
2nd call- Lmom asking pt to call back to schedule a physical.

## 2021-09-21 ENCOUNTER — Telehealth: Payer: Self-pay | Admitting: Family Medicine

## 2021-09-21 NOTE — Telephone Encounter (Signed)
Copied from [place] [id number]. Topic: Medicare AWV >> Sep 21, 2021  8:33 AM Lavonia Drafts wrote: Reason for CRM: Left message for patient to call back and schedule Medicare Annual Wellness Visit (AWV) to be done virtually or by telephone.  No hx of AWV eligible as of 09/06/21  Please schedule at anytime with [facility] Health Advisor.      50 Minutes appointment   Any questions, please call me at [phone]

## 2021-11-10 ENCOUNTER — Telehealth: Payer: Self-pay | Admitting: Family Medicine

## 2021-11-10 NOTE — Telephone Encounter (Signed)
Copied from [place] [phone]. Topic: Medicare AWV ?>> Nov 10, 2021  2:17 PM Lavonia Drafts wrote: ?Reason for CRM:  ?No answer, unable to leave a message for patient to call back and schedule Medicare Annual Wellness Visit (AWV) to be done virtually or by telephone. ? ?No hx of AWV eligible as of 09/06/21 ? ?Please schedule at anytime with [facility] Health Advisor.     ? ?45 Minutes appointment  ? ?Any questions, please call me at [phone] ?

## 2022-02-25 ENCOUNTER — Ambulatory Visit (INDEPENDENT_AMBULATORY_CARE_PROVIDER_SITE_OTHER): Payer: Medicare Other | Admitting: Family Medicine

## 2022-02-25 ENCOUNTER — Encounter: Payer: Self-pay | Admitting: Family Medicine

## 2022-02-25 VITALS — BP 123/65 | HR 56 | Temp 98.0°F | Ht 62.0 in | Wt 126.4 lb

## 2022-02-25 DIAGNOSIS — Z136 Encounter for screening for cardiovascular disorders: Secondary | ICD-10-CM | POA: Diagnosis not present

## 2022-02-25 DIAGNOSIS — Z1231 Encounter for screening mammogram for malignant neoplasm of breast: Secondary | ICD-10-CM | POA: Diagnosis not present

## 2022-02-25 DIAGNOSIS — Z0000 Encounter for general adult medical examination without abnormal findings: Secondary | ICD-10-CM | POA: Diagnosis not present

## 2022-02-25 DIAGNOSIS — Z131 Encounter for screening for diabetes mellitus: Secondary | ICD-10-CM

## 2022-02-25 DIAGNOSIS — R5383 Other fatigue: Secondary | ICD-10-CM

## 2022-02-25 DIAGNOSIS — D692 Other nonthrombocytopenic purpura: Secondary | ICD-10-CM | POA: Diagnosis not present

## 2022-02-25 DIAGNOSIS — Z780 Asymptomatic menopausal state: Secondary | ICD-10-CM

## 2022-02-25 LAB — URINALYSIS, ROUTINE W REFLEX MICROSCOPIC
Bilirubin, UA: NEGATIVE
Glucose, UA: NEGATIVE
Ketones, UA: NEGATIVE
Nitrite, UA: NEGATIVE
Protein,UA: NEGATIVE
Specific Gravity, UA: <1.03 (ref 1.005–1.030)
Urobilinogen, Ur: 0.2 mg/dL (ref 0.2–1.0)
pH, UA: 6.5 (ref 5.0–7.5)

## 2022-02-25 LAB — MICROSCOPIC EXAMINATION

## 2022-02-25 LAB — BAYER DCA HB A1C WAIVED: HB A1C (BAYER DCA - WAIVED): 5.2 % (ref 4.8–5.6)

## 2022-02-25 NOTE — Addendum Note (Signed)
Addended by: Valerie Roys on: 02/25/2022 12:10 PM   Modules accepted: Orders

## 2022-02-25 NOTE — Progress Notes (Signed)
Interpreted by me on 02/25/22. Bradycardic at 58bpm. No ST segment changes.

## 2022-02-25 NOTE — Assessment & Plan Note (Signed)
Reassured patient. Continue to monitor.  

## 2022-02-25 NOTE — Patient Instructions (Addendum)
Please call to schedule your mammogram and/or bone density: [facility]  Address: [address], [place], [place] [address] Phone: [phone]  Preventative Services:  AAA screening: N/A Health Risk Assessment and Personalized Prevention Plan: Done today Bone Mass Measurements: Ordered today Breast Cancer Screening: Ordered today CVD Screening: Done today Cervical Cancer Screening: N/A Colon Cancer Screening: up to date Depression Screening: Done today Diabetes Screening: Done today Glaucoma Screening: See your eye doctor Hepatitis B vaccine: N/A Hepatitis C screening: Up to date HIV Screening: Up to date Flu Vaccine: Get in the Fall Lung cancer Screening: N/A Obesity Screening: Done today Pneumonia Vaccines: Will think about it STI Screening: N/A

## 2022-02-26 ENCOUNTER — Encounter: Payer: Self-pay | Admitting: Family Medicine

## 2022-02-26 LAB — CBC WITH DIFFERENTIAL/PLATELET
Basophils Absolute: 0.1 10*3/uL (ref 0.0–0.2)
Basos: 1 %
EOS (ABSOLUTE): 0.1 10*3/uL (ref 0.0–0.4)
Eos: 1 %
Hematocrit: 37.2 % (ref 34.0–46.6)
Hemoglobin: 12.4 g/dL (ref 11.1–15.9)
Immature Grans (Abs): 0 10*3/uL (ref 0.0–0.1)
Immature Granulocytes: 0 %
Lymphocytes Absolute: 2.4 10*3/uL (ref 0.7–3.1)
Lymphs: 36 %
MCH: 30.5 pg (ref 26.6–33.0)
MCHC: 33.3 g/dL (ref 31.5–35.7)
MCV: 91 fL (ref 79–97)
Monocytes Absolute: 0.4 10*3/uL (ref 0.1–0.9)
Monocytes: 5 %
Neutrophils Absolute: 3.7 10*3/uL (ref 1.4–7.0)
Neutrophils: 57 %
Platelets: 231 10*3/uL (ref 150–450)
RBC: 4.07 x10E6/uL (ref 3.77–5.28)
RDW: 13.3 % (ref 11.7–15.4)
WBC: 6.6 10*3/uL (ref 3.4–10.8)

## 2022-02-26 LAB — TSH: TSH: 1.26 u[IU]/mL (ref 0.450–4.500)

## 2022-02-26 LAB — COMPREHENSIVE METABOLIC PANEL
ALT: 20 IU/L (ref 0–32)
AST: 21 IU/L (ref 0–40)
Albumin/Globulin Ratio: 1.8 (ref 1.2–2.2)
Albumin: 4.5 g/dL (ref 3.8–4.8)
Alkaline Phosphatase: 115 IU/L (ref 44–121)
BUN/Creatinine Ratio: 22 (ref 12–28)
BUN: 15 mg/dL (ref 8–27)
Bilirubin Total: 0.5 mg/dL (ref 0.0–1.2)
CO2: 26 mmol/L (ref 20–29)
Calcium: 10.1 mg/dL (ref 8.7–10.3)
Chloride: 103 mmol/L (ref 96–106)
Creatinine, Ser: 0.68 mg/dL (ref 0.57–1.00)
Globulin, Total: 2.5 g/dL (ref 1.5–4.5)
Glucose: 89 mg/dL (ref 70–99)
Potassium: 4 mmol/L (ref 3.5–5.2)
Sodium: 143 mmol/L (ref 134–144)
Total Protein: 7 g/dL (ref 6.0–8.5)
eGFR: 96 mL/min/{1.73_m2} (ref >59)

## 2022-02-26 LAB — LIPID PANEL W/O CHOL/HDL RATIO
Cholesterol, Total: 198 mg/dL (ref 100–199)
HDL: 83 mg/dL (ref >39)
LDL Chol Calc (NIH): 101 mg/dL — ABNORMAL HIGH (ref 0–99)
Triglycerides: 78 mg/dL (ref 0–149)
VLDL Cholesterol Cal: 14 mg/dL (ref 5–40)

## 2022-05-18 ENCOUNTER — Telehealth: Payer: Self-pay | Admitting: Family Medicine

## 2022-05-18 NOTE — Telephone Encounter (Signed)
Copied from [place] [phone]. Topic: General - Other >> May 18, 2022  1:35 PM Oley Balm E wrote: Reason for CRM: Pt called reporting that her insurance will not cover her bone density exam if it is not listed as medically necessary. The patient is scheduled for Monday but will reschedule to give PCP time to have this adjusted. Please advise

## 2022-05-24 ENCOUNTER — Ambulatory Visit
Admission: RE | Admit: 2022-05-24 | Discharge: 2022-05-24 | Disposition: A | Discharge status: Home / Self Care | Payer: Medicare Other | Source: Ambulatory Visit | Attending: Family Medicine | Admitting: Family Medicine

## 2022-05-24 ENCOUNTER — Other Ambulatory Visit: Payer: BC Managed Care – PPO

## 2022-05-24 DIAGNOSIS — Z1231 Encounter for screening mammogram for malignant neoplasm of breast: Secondary | ICD-10-CM | POA: Insufficient documentation

## 2022-05-24 DIAGNOSIS — Z0000 Encounter for general adult medical examination without abnormal findings: Secondary | ICD-10-CM

## 2022-05-31 NOTE — Telephone Encounter (Signed)
She's never had one, I'm not sure what they mean by medical necessity

## 2022-07-27 ENCOUNTER — Ambulatory Visit: Payer: BC Managed Care – PPO | Admitting: Dermatology

## 2022-10-26 ENCOUNTER — Encounter: Payer: Self-pay | Admitting: Dermatology

## 2022-10-26 ENCOUNTER — Ambulatory Visit (INDEPENDENT_AMBULATORY_CARE_PROVIDER_SITE_OTHER): Payer: Medicare Other | Admitting: Dermatology

## 2022-10-26 ENCOUNTER — Other Ambulatory Visit: Payer: Self-pay | Admitting: Dermatology

## 2022-10-26 VITALS — BP 163/85 | HR 64

## 2022-10-26 DIAGNOSIS — D485 Neoplasm of uncertain behavior of skin: Secondary | ICD-10-CM

## 2022-10-26 DIAGNOSIS — D2371 Other benign neoplasm of skin of right lower limb, including hip: Secondary | ICD-10-CM

## 2022-10-26 DIAGNOSIS — C44729 Squamous cell carcinoma of skin of left lower limb, including hip: Secondary | ICD-10-CM | POA: Diagnosis not present

## 2022-10-26 DIAGNOSIS — D0472 Carcinoma in situ of skin of left lower limb, including hip: Secondary | ICD-10-CM | POA: Diagnosis not present

## 2022-10-26 DIAGNOSIS — Z86018 Personal history of other benign neoplasm: Secondary | ICD-10-CM

## 2022-10-26 DIAGNOSIS — D225 Melanocytic nevi of trunk: Secondary | ICD-10-CM | POA: Diagnosis not present

## 2022-10-26 DIAGNOSIS — Z1283 Encounter for screening for malignant neoplasm of skin: Secondary | ICD-10-CM | POA: Diagnosis not present

## 2022-10-26 DIAGNOSIS — C4492 Squamous cell carcinoma of skin, unspecified: Secondary | ICD-10-CM

## 2022-10-26 DIAGNOSIS — D2272 Melanocytic nevi of left lower limb, including hip: Secondary | ICD-10-CM

## 2022-10-26 DIAGNOSIS — L814 Other melanin hyperpigmentation: Secondary | ICD-10-CM

## 2022-10-26 DIAGNOSIS — Z85828 Personal history of other malignant neoplasm of skin: Secondary | ICD-10-CM

## 2022-10-26 DIAGNOSIS — D229 Melanocytic nevi, unspecified: Secondary | ICD-10-CM

## 2022-10-26 DIAGNOSIS — L578 Other skin changes due to chronic exposure to nonionizing radiation: Secondary | ICD-10-CM

## 2022-10-26 DIAGNOSIS — L821 Other seborrheic keratosis: Secondary | ICD-10-CM

## 2022-10-26 HISTORY — DX: Squamous cell carcinoma of skin, unspecified: C44.92

## 2022-10-26 NOTE — Patient Instructions (Addendum)
Wound Care Instructions  Cleanse wound gently with soap and water once a day then pat dry with clean gauze. Apply a thin coat of Petrolatum (petroleum jelly, "Vaseline") over the wound (unless you have an allergy to this). We recommend that you use a new, sterile tube of Vaseline. Do not pick or remove scabs. Do not remove the yellow or white "healing tissue" from the base of the wound.  Cover the wound with fresh, clean, nonstick gauze and secure with paper tape. You may use Band-Aids in place of gauze and tape if the wound is small enough, but would recommend trimming much of the tape off as there is often too much. Sometimes Band-Aids can irritate the skin.  You should call the office for your biopsy report after 1 week if you have not already been contacted.  If you experience any problems, such as abnormal amounts of bleeding, swelling, significant bruising, significant pain, or evidence of infection, please call the office immediately.  FOR ADULT SURGERY PATIENTS: If you need something for pain relief you may take 1 extra strength Tylenol (acetaminophen) AND 2 Ibuprofen (271m each) together every 4 hours as needed for pain. (do not take these if you are allergic to them or if you have a reason you should not take them.) Typically, you may only need pain medication for 1 to 3 days.      Melanoma ABCDEs  Melanoma is the most dangerous type of skin cancer, and is the leading cause of death from skin disease.  You are more likely to develop melanoma if you: Have light-colored skin, light-colored eyes, or red or blond hair Spend a lot of time in the sun Tan regularly, either outdoors or in a tanning bed Have had blistering sunburns, especially during childhood Have a close family member who has had a melanoma Have atypical moles or large birthmarks  Early detection of melanoma is key since treatment is typically straightforward and cure rates are extremely high if we catch it early.    The first sign of melanoma is often a change in a mole or a new dark spot.  The ABCDE system is a way of remembering the signs of melanoma.  A for asymmetry:  The two halves do not match. B for border:  The edges of the growth are irregular. C for color:  A mixture of colors are present instead of an even brown color. D for diameter:  Melanomas are usually (but not always) greater than 612m- the size of a pencil eraser. E for evolution:  The spot keeps changing in size, shape, and color.  Please check your skin once per month between visits. You can use a small mirror in front and a large mirror behind you to keep an eye on the back side or your body.   If you see any new or changing lesions before your next follow-up, please call to schedule a visit.  Please continue daily skin protection including broad spectrum sunscreen SPF 30+ to sun-exposed areas, reapplying every 2 hours as needed when you're outdoors.   Staying in the shade or wearing long sleeves, sun glasses (UVA+UVB protection) and wide brim hats (4-inch brim around the entire circumference of the hat) are also recommended for sun protection.    Due to recent changes in healthcare laws, you may see results of your pathology and/or laboratory studies on MyChart before the doctors have had a chance to review them. We understand that in some cases there may  be results that are confusing or concerning to you. Please understand that not all results are received at the same time and often the doctors may need to interpret multiple results in order to provide you with the best plan of care or course of treatment. Therefore, we ask that you please give [place] 2 business days to thoroughly review all your results before contacting the office for clarification. Should we see a critical lab result, you will be contacted sooner.   If You Need Anything After Your Visit  If you have any questions or concerns for your doctor, please call our main  line at [phone] and press option 4 to reach your doctor's medical assistant. If no one answers, please leave a voicemail as directed and we will return your call as soon as possible. Messages left after 4 pm will be answered the following business day.   You may also send [place] a message via [place]. We typically respond to MyChart messages within 1-2 business days.  For prescription refills, please ask your pharmacy to contact our office. Our fax number is [phone].  If you have an urgent issue when the clinic is closed that cannot wait until the next business day, you can page your doctor at the number below.    Please note that while we do our best to be available for urgent issues outside of office hours, we are not available 24/7.   If you have an urgent issue and are unable to reach [place], you may choose to seek medical care at your doctor's office, retail clinic, urgent care center, or emergency room.  If you have a medical emergency, please immediately call 911 or go to the emergency department.  Pager Numbers  - Dr. Nehemiah Massed: [phone]  - Dr. Laurence Ferrari: [phone]  - Dr. Nicole Kindred: [phone]  In the event of inclement weather, please call our main line at [phone] for an update on the status of any delays or closures.  Dermatology Medication Tips: Please keep the boxes that topical medications come in in order to help keep track of the instructions about where and how to use these. Pharmacies typically print the medication instructions only on the boxes and not directly on the medication tubes.   If your medication is too expensive, please contact our office at [phone] option 4 or send [place] a message through [place].   We are unable to tell what your co-pay for medications will be in advance as this is different depending on your insurance coverage. However, we may be able to find a substitute medication at lower cost or fill out paperwork to get insurance to cover a  needed medication.   If a prior authorization is required to get your medication covered by your insurance company, please allow [place] 1-2 business days to complete this process.  Drug prices often vary depending on where the prescription is filled and some pharmacies may offer cheaper prices.  The website www.goodrx.com contains coupons for medications through different pharmacies. The prices here do not account for what the cost may be with help from insurance (it may be cheaper with your insurance), but the website can give you the price if you did not use any insurance.  - You can print the associated coupon and take it with your prescription to the pharmacy.  - You may also stop by our office during regular business hours and pick up a GoodRx coupon card.  - If you need your prescription sent electronically to a  different pharmacy, notify our office through [facility] or by phone at [phone] option 4.     Si Usted Necesita Algo Despus de Su Visita  Tambin puede enviarnos un mensaje a travs de Pharmacist, community. Por lo general respondemos a los mensajes de MyChart en el transcurso de 1 a 2 das hbiles.  Para renovar recetas, por favor pida a su farmacia que se ponga en contacto con nuestra oficina. Harland Dingwall de fax es [place] [phone].  Si tiene un asunto urgente cuando la clnica est cerrada y que no puede esperar hasta el siguiente da hbil, puede llamar/localizar a su doctor(a) al nmero que aparece a continuacin.   Por favor, tenga en cuenta que aunque hacemos todo lo posible para estar disponibles para asuntos urgentes fuera del horario de [place], no estamos disponibles las 24 horas del da, los 7 das de la [place].   Si tiene un problema urgente y no puede comunicarse con nosotros, puede optar por buscar atencin mdica  en el consultorio de su doctor(a), en una clnica privada, en un centro de atencin urgente o en una sala de emergencias.  Si tiene Architect, por favor llame inmediatamente al 911 o vaya a la sala de emergencias.  Nmeros de bper  - Dr. Nehemiah Massed: [phone]  - Dra. Moye: [phone]  - Dra. Nicole Kindred: [phone]  En caso de inclemencias del [place], por favor llame a Johnsie Kindred principal al [phone] para una actualizacin sobre el [place] de cualquier retraso o cierre.  Consejos para la medicacin en dermatologa: Por favor, guarde las cajas en las que vienen los medicamentos de uso tpico para ayudarle a seguir las instrucciones sobre dnde y cmo usarlos. Las farmacias generalmente imprimen las instrucciones del medicamento slo en las cajas y no directamente en los tubos del [place].   Si su medicamento es muy caro, por favor, pngase en contacto con Zigmund Daniel llamando al [phone] y presione la opcin 4 o envenos un mensaje a travs de Pharmacist, community.   No podemos decirle cul ser su copago por los medicamentos por adelantado ya que esto es diferente dependiendo de la cobertura de su seguro. Sin embargo, es posible que podamos encontrar un medicamento sustituto a Electrical engineer un formulario para que el seguro cubra el medicamento que se considera necesario.   Si se requiere una autorizacin previa para que su compaa de seguros [place] su medicamento, por favor permtanos de 1 a 2 das hbiles para completar este proceso.  Los precios de los medicamentos varan con frecuencia dependiendo del Environmental consultant de dnde se surte la receta y alguna farmacias pueden ofrecer precios ms baratos.  El sitio web www.goodrx.com tiene cupones para medicamentos de Airline pilot. Los precios aqu no tienen en cuenta lo que podra costar con la ayuda del seguro (puede ser ms barato con su seguro), pero el sitio web puede darle el precio si no utiliz Research scientist (physical sciences).  - Puede imprimir el cupn correspondiente y llevarlo con su receta a la farmacia.  - Tambin puede pasar por nuestra oficina durante el horario de  atencin regular y Charity fundraiser una tarjeta de cupones de GoodRx.  - Si necesita que su receta se enve electrnicamente a una farmacia diferente, informe a nuestra oficina a travs de MyChart de [place] o por telfono llamando al [phone] y presione la opcin 4.

## 2022-10-26 NOTE — Progress Notes (Signed)
New Patient Visit  Subjective  Robin Charles is a 67 y.o. female who presents for the following: Total Body Skin Exam.  The patient presents for Total-Body Skin Exam (TBSE) for skin cancer screening and mole check.  The patient has spots, moles and lesions to be evaluated, some may be new or changing and the patient has concerns that these could be cancer. She has a few spots she is concerned about on her left leg and nose. Present for  ~one year.  She has a history of dysplastic nevi on the right back and right inner thigh, treated here over 10 years ago. Also, history of multiple BCCs.    The following portions of the chart were reviewed this encounter and updated as appropriate:       Review of Systems:  No other skin or systemic complaints except as noted in HPI or Assessment and Plan.  Objective  Well appearing patient in no apparent distress; mood and affect are within normal limits.  A full examination was performed including scalp, head, eyes, ears, nose, lips, neck, chest, axillae, abdomen, back, buttocks, bilateral upper extremities, bilateral lower extremities, hands, feet, fingers, toes, fingernails, and toenails. All findings within normal limits unless otherwise noted below.  Right Spinal Lower Back 4.0 mm brown macule, darker center  Left Anterior Thigh 4.0 mm speckled brown macule  Left Lower Calf 3.0 mm brown macule  nasal dorsum 5.0 mm tan macule  left lateral knee 10 x 6 mm pink scaly flat papule (appears as two adjacent) with surrounding hypopigmentation       left pretibia 6.0 mm pink pearly thin papule          Assessment & Plan  Skin cancer screening performed today.  Actinic Damage - chronic, secondary to cumulative UV radiation exposure/sun exposure over time - diffuse scaly erythematous macules with underlying dyspigmentation - Recommend daily broad spectrum sunscreen SPF 30+ to sun-exposed areas, reapply every 2 hours as  needed.  - Recommend staying in the shade or wearing long sleeves, sun glasses (UVA+UVB protection) and wide brim hats (4-inch brim around the entire circumference of the hat). - Call for new or changing lesions.  Dermatofibroma - Firm pink/brown papulenodule with dimple sign, R med calf, R calf - Benign appearing - Call for any changes  Lentigines - Scattered tan macules - Due to sun exposure - Benign-appearing, observe - Recommend daily broad spectrum sunscreen SPF 30+ to sun-exposed areas, reapply every 2 hours as needed. - Call for any changes  Hemangiomas - Red papules - Discussed benign nature - Observe - Call for any changes  Melanocytic Nevi - Tan-brown and/or pink-flesh-colored symmetric macules and papules - Benign appearing on exam today - Observation - Call clinic for new or changing moles - Recommend daily use of broad spectrum spf 30+ sunscreen to sun-exposed areas.   Seborrheic Keratoses - Stuck-on, waxy, tan-brown papules and/or plaques  - Benign-appearing - Discussed benign etiology and prognosis. - Observe - Call for any changes  History of Dysplastic Nevi - No evidence of recurrence today - Recommend regular full body skin exams - Recommend daily broad spectrum sunscreen SPF 30+ to sun-exposed areas, reapply every 2 hours as needed.  - Call if any new or changing lesions are noted between office visits  History of Basal Cell Carcinoma of the Skin - No evidence of recurrence today - Recommend regular full body skin exams - Recommend daily broad spectrum sunscreen SPF 30+ to sun-exposed areas, reapply every  2 hours as needed.  - Call if any new or changing lesions are noted between office visits  Nevus (3) Left Anterior Thigh; Left Lower Calf; Right Spinal Lower Back  Benign-appearing.  Observation.  Call clinic for new or changing moles.  Recommend daily use of broad spectrum spf 30+ sunscreen to sun-exposed areas.   Lentigo nasal  dorsum  Benign-appearing.  Observation.  Call clinic for new or changing lesions.  Recommend daily use of broad spectrum spf 30+ sunscreen to sun-exposed areas.     Neoplasm of uncertain behavior of skin (2) left lateral knee  Skin / nail biopsy Type of biopsy: tangential   Informed consent: discussed and consent obtained   Patient was prepped and draped in usual sterile fashion: Area prepped with alcohol. Anesthesia: the lesion was anesthetized in a standard fashion   Anesthetic:  1% lidocaine w/ epinephrine 1-100,000 buffered w/ 8.4% NaHCO3 Instrument used: flexible razor blade   Hemostasis achieved with: pressure, aluminum chloride and electrodesiccation   Outcome: patient tolerated procedure well   Post-procedure details: wound care instructions given   Post-procedure details comment:  Ointment and small bandage applied  Specimen 1 - Surgical pathology Differential Diagnosis: Inflamed SK r/o SCCIS  Check Margins: No  left pretibia  Skin / nail biopsy Type of biopsy: tangential   Informed consent: discussed and consent obtained   Patient was prepped and draped in usual sterile fashion: Area prepped with alcohol. Anesthesia: the lesion was anesthetized in a standard fashion   Anesthetic:  1% lidocaine w/ epinephrine 1-100,000 buffered w/ 8.4% NaHCO3 Instrument used: flexible razor blade   Hemostasis achieved with: pressure, aluminum chloride and electrodesiccation   Outcome: patient tolerated procedure well   Post-procedure details: wound care instructions given   Post-procedure details comment:  Ointment and small bandage applied  Specimen 2 - Surgical pathology Differential Diagnosis: Inflamed SK r/o BCC  Check Margins: No   Return if symptoms worsen or fail to improve, pending biopsy.  IJamesetta Orleans, CMA, am acting as scribe for Brendolyn Patty, MD .  Documentation: I have reviewed the above documentation for accuracy and completeness, and I agree with the  above.  Brendolyn Patty MD

## 2022-11-01 ENCOUNTER — Telehealth: Payer: Self-pay

## 2022-11-01 NOTE — Telephone Encounter (Signed)
-----   Message from Brendolyn Patty, MD sent at 11/01/2022 12:49 PM EST ----- 1. Skin , left lateral knee SQUAMOUS CELL CARCINOMA IN SITU 2. Skin , left pretibia SUPERFICIAL BASAL CELL CARCINOMA  1. SCCIS skin cancer- needs EDC 2. Superficial BCC skin cancer- needs EDC   - please call patient

## 2022-11-01 NOTE — Telephone Encounter (Signed)
Advised pt of bx results and scheduled pt for EDC x 2./sh

## 2022-12-08 ENCOUNTER — Ambulatory Visit (INDEPENDENT_AMBULATORY_CARE_PROVIDER_SITE_OTHER): Payer: Medicare Other | Admitting: Dermatology

## 2022-12-08 ENCOUNTER — Encounter: Payer: Self-pay | Admitting: Dermatology

## 2022-12-08 VITALS — BP 143/71 | HR 59

## 2022-12-08 DIAGNOSIS — L578 Other skin changes due to chronic exposure to nonionizing radiation: Secondary | ICD-10-CM | POA: Diagnosis not present

## 2022-12-08 DIAGNOSIS — C44719 Basal cell carcinoma of skin of left lower limb, including hip: Secondary | ICD-10-CM | POA: Diagnosis not present

## 2022-12-08 DIAGNOSIS — D0472 Carcinoma in situ of skin of left lower limb, including hip: Secondary | ICD-10-CM | POA: Diagnosis not present

## 2022-12-08 DIAGNOSIS — D099 Carcinoma in situ, unspecified: Secondary | ICD-10-CM

## 2022-12-08 NOTE — Progress Notes (Signed)
   Follow-Up Visit   Subjective  Robin Charles is a 67 y.o. female who presents for the following: Follow-up.  [facility] of the left lateral knee and Superficial BCC of the left pretibia, biopsy proven.   The following portions of the chart were reviewed this encounter and updated as appropriate:       Review of Systems:  No other skin or systemic complaints except as noted in HPI or Assessment and Plan.  Objective  Well appearing patient in no apparent distress; mood and affect are within normal limits.  A focused examination was performed including face, legs. Relevant physical exam findings are noted in the Assessment and Plan.  Left Lateral Knee Pink biopsy site.  Left Pretibia Pink biopsy site.    Assessment & Plan  ACTINIC DAMAGE - chronic, secondary to cumulative UV radiation exposure/sun exposure over time - diffuse scaly erythematous macules with underlying dyspigmentation - Recommend daily broad spectrum sunscreen SPF 30+ to sun-exposed areas, reapply every 2 hours as needed.  - Recommend staying in the shade or wearing long sleeves, sun glasses (UVA+UVB protection) and wide brim hats (4-inch brim around the entire circumference of the hat). - Call for new or changing lesions.  Squamous cell carcinoma in situ Left Lateral Knee  Destruction of lesion  Destruction method: electrodesiccation and curettage   Informed consent: discussed and consent obtained   Timeout:  patient name, date of birth, surgical site, and procedure verified Anesthesia: the lesion was anesthetized in a standard fashion   Anesthetic:  1% lidocaine w/ epinephrine 1-100,000 local infiltration Curettage performed in three different directions: Yes   Electrodesiccation performed over the curetted area: Yes   Final wound size (cm):  1.1 Hemostasis achieved with:  pressure, aluminum chloride and electrodesiccation Outcome: patient tolerated procedure well with no  complications   Post-procedure details: wound care instructions given   Post-procedure details comment:  Ointment and bandage applied.  Biopsy proven.  Basal cell carcinoma (BCC) of skin of left lower extremity including hip Left Pretibia  Destruction of lesion  Destruction method: electrodesiccation and curettage   Informed consent: discussed and consent obtained   Timeout:  patient name, date of birth, surgical site, and procedure verified Anesthesia: the lesion was anesthetized in a standard fashion   Anesthetic:  1% lidocaine w/ epinephrine 1-100,000 local infiltration Curettage performed in three different directions: Yes   Electrodesiccation performed over the curetted area: Yes   Final wound size (cm):  0.8 Hemostasis achieved with:  pressure, aluminum chloride and electrodesiccation Outcome: patient tolerated procedure well with no complications   Post-procedure details: wound care instructions given   Post-procedure details comment:  Ointment and bandage applied.  Biopsy proven.   Return in about 6 months (around 06/09/2023) for recheck BCC/SCC sites.  IJamesetta Orleans, CMA, am acting as scribe for Brendolyn Patty, MD .   Documentation: I have reviewed the above documentation for accuracy and completeness, and I agree with the above.  Brendolyn Patty MD

## 2022-12-08 NOTE — Patient Instructions (Signed)
Electrodesiccation and Curettage ("Scrape and Burn") Wound Care Instructions  Leave the original bandage on for 24 hours if possible.  If the bandage becomes soaked or soiled before that time, it is OK to remove it and examine the wound.  A small amount of post-operative bleeding is normal.  If excessive bleeding occurs, remove the bandage, place gauze over the site and apply continuous pressure (no peeking) over the area for 30 minutes. If this does not work, please call our clinic as soon as possible or page your doctor if it is after hours.   Once a day, cleanse the wound with soap and water. It is fine to shower. If a thick crust develops you may use a Q-tip dipped into dilute hydrogen peroxide (mix 1:1 with water) to dissolve it.  Hydrogen peroxide can slow the healing process, so use it only as needed.    After washing, apply petroleum jelly (Vaseline) or an antibiotic ointment if your doctor prescribed one for you, followed by a bandage.    For best healing, the wound should be covered with a layer of ointment at all times. If you are not able to keep the area covered with a bandage to hold the ointment in place, this may mean re-applying the ointment several times a day.  Continue this wound care until the wound has healed and is no longer open. It may take several weeks for the wound to heal and close.  Itching and mild discomfort is normal during the healing process.  If you have any discomfort, you can take Tylenol (acetaminophen) or ibuprofen as directed on the bottle. (Please do not take these if you have an allergy to them or cannot take them for another reason).  Some redness, tenderness and white or yellow material in the wound is normal healing.  If the area becomes very sore and red, or develops a thick yellow-green material (pus), it may be infected; please notify us.    Wound healing continues for up to one year following surgery. It is not unusual to experience pain in the scar  from time to time during the interval.  If the pain becomes severe or the scar thickens, you should notify the office.    A slight amount of redness in a scar is expected for the first six months.  After six months, the redness will fade and the scar will soften and fade.  The color difference becomes less noticeable with time.  If there are any problems, return for a post-op surgery check at your earliest convenience.  To improve the appearance of the scar, you can use silicone scar gel, cream, or sheets (such as Mederma or Serica) every night for up to one year. These are available over the counter (without a prescription).  Please call our office at [phone] for any questions or concerns.     Due to recent changes in healthcare laws, you may see results of your pathology and/or laboratory studies on MyChart before the doctors have had a chance to review them. We understand that in some cases there may be results that are confusing or concerning to you. Please understand that not all results are received at the same time and often the doctors may need to interpret multiple results in order to provide you with the best plan of care or course of treatment. Therefore, we ask that you please give us 2 business days to thoroughly review all your results before contacting the office for clarification. Should   we see a critical lab result, you will be contacted sooner.   If You Need Anything After Your Visit  If you have any questions or concerns for your doctor, please call our main line at [phone] and press option 4 to reach your doctor's medical assistant. If no one answers, please leave a voicemail as directed and we will return your call as soon as possible. Messages left after 4 pm will be answered the following business day.   You may also send us a message via MyChart. We typically respond to MyChart messages within 1-2 business days.  For prescription refills, please ask your  pharmacy to contact our office. Our fax number is [phone].  If you have an urgent issue when the clinic is closed that cannot wait until the next business day, you can page your doctor at the number below.    Please note that while we do our best to be available for urgent issues outside of office hours, we are not available 24/7.   If you have an urgent issue and are unable to reach us, you may choose to seek medical care at your doctor's office, retail clinic, urgent care center, or emergency room.  If you have a medical emergency, please immediately call 911 or go to the emergency department.  Pager Numbers  - Dr. Kowalski: [phone]  - Dr. Moye: [phone]  - Dr. Stewart: [phone]  In the event of inclement weather, please call our main line at [phone] for an update on the status of any delays or closures.  Dermatology Medication Tips: Please keep the boxes that topical medications come in in order to help keep track of the instructions about where and how to use these. Pharmacies typically print the medication instructions only on the boxes and not directly on the medication tubes.   If your medication is too expensive, please contact our office at [phone] option 4 or send us a message through MyChart.   We are unable to tell what your co-pay for medications will be in advance as this is different depending on your insurance coverage. However, we may be able to find a substitute medication at lower cost or fill out paperwork to get insurance to cover a needed medication.   If a prior authorization is required to get your medication covered by your insurance company, please allow us 1-2 business days to complete this process.  Drug prices often vary depending on where the prescription is filled and some pharmacies may offer cheaper prices.  The website www.goodrx.com contains coupons for medications through different pharmacies. The prices here do not  account for what the cost may be with help from insurance (it may be cheaper with your insurance), but the website can give you the price if you did not use any insurance.  - You can print the associated coupon and take it with your prescription to the pharmacy.  - You may also stop by our office during regular business hours and pick up a GoodRx coupon card.  - If you need your prescription sent electronically to a different pharmacy, notify our office through [facility] MyChart or by phone at [phone] option 4.     Si Usted Necesita Algo Despus de Su Visita  Tambin puede enviarnos un mensaje a travs de MyChart. Por lo general respondemos a los mensajes de MyChart en el transcurso de 1 a 2 das hbiles.  Para renovar recetas, por favor pida a su farmacia que se ponga en   contacto con nuestra oficina. Nuestro nmero de fax es el [phone].  Si tiene un asunto urgente cuando la clnica est cerrada y que no puede esperar hasta el siguiente da hbil, puede llamar/localizar a su doctor(a) al nmero que aparece a continuacin.   Por favor, tenga en cuenta que aunque hacemos todo lo posible para estar disponibles para asuntos urgentes fuera del horario de oficina, no estamos disponibles las 24 horas del da, los 7 das de la semana.   Si tiene un problema urgente y no puede comunicarse con nosotros, puede optar por buscar atencin mdica  en el consultorio de su doctor(a), en una clnica privada, en un centro de atencin urgente o en una sala de emergencias.  Si tiene una emergencia mdica, por favor llame inmediatamente al 911 o vaya a la sala de emergencias.  Nmeros de bper  - Dr. Kowalski: [phone]  - Dra. Moye: [phone]  - Dra. Stewart: [phone]  En caso de inclemencias del tiempo, por favor llame a nuestra lnea principal al [phone] para una actualizacin sobre el estado de cualquier retraso o cierre.  Consejos para la medicacin en dermatologa: Por  favor, guarde las cajas en las que vienen los medicamentos de uso tpico para ayudarle a seguir las instrucciones sobre dnde y cmo usarlos. Las farmacias generalmente imprimen las instrucciones del medicamento slo en las cajas y no directamente en los tubos del medicamento.   Si su medicamento es muy caro, por favor, pngase en contacto con nuestra oficina llamando al [phone] y presione la opcin 4 o envenos un mensaje a travs de MyChart.   No podemos decirle cul ser su copago por los medicamentos por adelantado ya que esto es diferente dependiendo de la cobertura de su seguro. Sin embargo, es posible que podamos encontrar un medicamento sustituto a menor costo o llenar un formulario para que el seguro cubra el medicamento que se considera necesario.   Si se requiere una autorizacin previa para que su compaa de seguros cubra su medicamento, por favor permtanos de 1 a 2 das hbiles para completar este proceso.  Los precios de los medicamentos varan con frecuencia dependiendo del lugar de dnde se surte la receta y alguna farmacias pueden ofrecer precios ms baratos.  El sitio web www.goodrx.com tiene cupones para medicamentos de diferentes farmacias. Los precios aqu no tienen en cuenta lo que podra costar con la ayuda del seguro (puede ser ms barato con su seguro), pero el sitio web puede darle el precio si no utiliz ningn seguro.  - Puede imprimir el cupn correspondiente y llevarlo con su receta a la farmacia.  - Tambin puede pasar por nuestra oficina durante el horario de atencin regular y recoger una tarjeta de cupones de GoodRx.  - Si necesita que su receta se enve electrnicamente a una farmacia diferente, informe a nuestra oficina a travs de MyChart de [place] o por telfono llamando al [phone] y presione la opcin 4.  

## 2023-06-14 ENCOUNTER — Ambulatory Visit (INDEPENDENT_AMBULATORY_CARE_PROVIDER_SITE_OTHER): Payer: Medicare Other | Admitting: Dermatology

## 2023-06-14 ENCOUNTER — Encounter: Payer: Self-pay | Admitting: Dermatology

## 2023-06-14 DIAGNOSIS — Z85828 Personal history of other malignant neoplasm of skin: Secondary | ICD-10-CM

## 2023-06-14 DIAGNOSIS — Z86007 Personal history of in-situ neoplasm of skin: Secondary | ICD-10-CM

## 2023-06-14 DIAGNOSIS — L814 Other melanin hyperpigmentation: Secondary | ICD-10-CM | POA: Diagnosis not present

## 2023-06-14 NOTE — Patient Instructions (Signed)

## 2023-06-14 NOTE — Progress Notes (Signed)
   Follow-Up Visit   Subjective  Robin Charles is a 67 y.o. female who presents for the following: recheck BCC and SCCis sites treated with [facility] 12/2022.  The patient has spots, moles and lesions to be evaluated, some may be new or changing and the patient may have concern these could be cancer.   The following portions of the chart were reviewed this encounter and updated as appropriate: medications, allergies, medical history  Review of Systems:  No other skin or systemic complaints except as noted in HPI or Assessment and Plan.  Objective  Well appearing patient in no apparent distress; mood and affect are within normal limits.   A focused examination was performed of the following areas: legs  Relevant exam findings are noted in the Assessment and Plan.    Assessment & Plan   HISTORY OF BASAL CELL CARCINOMA OF THE SKIN - No evidence of recurrence today at left pretibia - Recommend regular full body skin exams - Recommend daily broad spectrum sunscreen SPF 30+ to sun-exposed areas, reapply every 2 hours as needed.  - Call if any new or changing lesions are noted between office visits  HISTORY OF SQUAMOUS CELL CARCINOMA IN SITU OF THE SKIN - No evidence of recurrence today at left lateral knee - Recommend regular full body skin exams - Recommend daily broad spectrum sunscreen SPF 30+ to sun-exposed areas, reapply every 2 hours as needed.  - Call if any new or changing lesions are noted between office visits  LENTIGINES Exam: scattered tan macules - 6 x 4 mm tan macule at nasal dorsum Due to sun exposure Treatment Plan: Benign-appearing, observe. Stable. Recommend daily broad spectrum sunscreen SPF 30+ to sun-exposed areas, reapply every 2 hours as needed.  Call for any changes   Return in about 6 months (around 12/13/2023) for with Dr. Roseanne Reno, TBSE, Hx BCC, Hx SCCis, Hx Dysplastic Nevi.  Anise Salvo, RMA, am acting as scribe for Willeen Niece, MD  .   Documentation: I have reviewed the above documentation for accuracy and completeness, and I agree with the above.  Willeen Niece, MD

## 2023-12-13 ENCOUNTER — Ambulatory Visit (INDEPENDENT_AMBULATORY_CARE_PROVIDER_SITE_OTHER): Payer: Medicare Other | Admitting: Dermatology

## 2023-12-13 DIAGNOSIS — D229 Melanocytic nevi, unspecified: Secondary | ICD-10-CM

## 2023-12-13 DIAGNOSIS — I781 Nevus, non-neoplastic: Secondary | ICD-10-CM

## 2023-12-13 DIAGNOSIS — D225 Melanocytic nevi of trunk: Secondary | ICD-10-CM

## 2023-12-13 DIAGNOSIS — Z1283 Encounter for screening for malignant neoplasm of skin: Secondary | ICD-10-CM | POA: Diagnosis not present

## 2023-12-13 DIAGNOSIS — L578 Other skin changes due to chronic exposure to nonionizing radiation: Secondary | ICD-10-CM | POA: Diagnosis not present

## 2023-12-13 DIAGNOSIS — I8393 Asymptomatic varicose veins of bilateral lower extremities: Secondary | ICD-10-CM

## 2023-12-13 DIAGNOSIS — L820 Inflamed seborrheic keratosis: Secondary | ICD-10-CM

## 2023-12-13 DIAGNOSIS — D2272 Melanocytic nevi of left lower limb, including hip: Secondary | ICD-10-CM

## 2023-12-13 DIAGNOSIS — D1801 Hemangioma of skin and subcutaneous tissue: Secondary | ICD-10-CM

## 2023-12-13 DIAGNOSIS — D2262 Melanocytic nevi of left upper limb, including shoulder: Secondary | ICD-10-CM

## 2023-12-13 DIAGNOSIS — L814 Other melanin hyperpigmentation: Secondary | ICD-10-CM | POA: Diagnosis not present

## 2023-12-13 DIAGNOSIS — W908XXA Exposure to other nonionizing radiation, initial encounter: Secondary | ICD-10-CM | POA: Diagnosis not present

## 2023-12-13 DIAGNOSIS — L821 Other seborrheic keratosis: Secondary | ICD-10-CM

## 2023-12-13 DIAGNOSIS — Z86007 Personal history of in-situ neoplasm of skin: Secondary | ICD-10-CM

## 2023-12-13 DIAGNOSIS — Z85828 Personal history of other malignant neoplasm of skin: Secondary | ICD-10-CM

## 2023-12-13 DIAGNOSIS — Z86018 Personal history of other benign neoplasm: Secondary | ICD-10-CM

## 2023-12-13 NOTE — Patient Instructions (Signed)

## 2023-12-13 NOTE — Progress Notes (Signed)
 Follow-Up Visit   Subjective  Robin Charles is a 68 y.o. female who presents for the following: Skin Cancer Screening and Full Body Skin Exam hx of BCC, SCC IS, Dysplastic nevus, check spot L arm ~2wks, check spot L thigh  The patient presents for Total-Body Skin Exam (TBSE) for skin cancer screening and mole check. The patient has spots, moles and lesions to be evaluated, some may be new or changing and the patient may have concern these could be cancer.    The following portions of the chart were reviewed this encounter and updated as appropriate: medications, allergies, medical history  Review of Systems:  No other skin or systemic complaints except as noted in HPI or Assessment and Plan.  Objective  Well appearing patient in no apparent distress; mood and affect are within normal limits.  A full examination was performed including scalp, head, eyes, ears, nose, lips, neck, chest, axillae, abdomen, back, buttocks, bilateral upper extremities, bilateral lower extremities, hands, feet, fingers, toes, fingernails, and toenails. All findings within normal limits unless otherwise noted below.   Relevant physical exam findings are noted in the Assessment and Plan.    Assessment & Plan   SKIN CANCER SCREENING PERFORMED TODAY.  ACTINIC DAMAGE - Chronic condition, secondary to cumulative UV/sun exposure - diffuse scaly erythematous macules with underlying dyspigmentation - Recommend daily broad spectrum sunscreen SPF 30+ to sun-exposed areas, reapply every 2 hours as needed.  - Staying in the shade or wearing long sleeves, sun glasses (UVA+UVB protection) and wide brim hats (4-inch brim around the entire circumference of the hat) are also recommended for sun protection.  - Call for new or changing lesions.  LENTIGINES, SEBORRHEIC KERATOSES, HEMANGIOMAS - Benign normal skin lesions - Benign-appearing - Call for any changes - Lentigo nasal dorsum 5.24mm tan  macule Benign-appearing. Stable compared to previous visit. Observation.  Call clinic for new or changing moles.  Recommend daily use of broad spectrum spf 30+ sunscreen to sun-exposed areas.    MELANOCYTIC NEVI - Tan-brown and/or pink-flesh-colored symmetric macules and papules - L ant thigh 4.45mm speckled brown macule - L lower calf 3.37mm speckled brown macule - R spinal lower back 4.59mm brown macule darker center - L post shoulder 3.17mm brown macule - Benign appearing on exam today - Observation - Call clinic for new or changing moles - Recommend daily use of broad spectrum spf 30+ sunscreen to sun-exposed areas.   HISTORY OF BASAL CELL CARCINOMA OF THE SKIN - No evidence of recurrence today- L temple, R cheek, post neck, L mid back, L pretibia - Recommend regular full body skin exams - Recommend daily broad spectrum sunscreen SPF 30+ to sun-exposed areas, reapply every 2 hours as needed.  - Call if any new or changing lesions are noted between office visits    HISTORY OF SQUAMOUS CELL CARCINOMA IN SITU OF THE SKIN - No evidence of recurrence today- L lat knee - Recommend regular full body skin exams - Recommend daily broad spectrum sunscreen SPF 30+ to sun-exposed areas, reapply every 2 hours as needed.  - Call if any new or changing lesions are noted between office visits    HISTORY OF DYSPLASTIC NEVUS No evidence of recurrence today- R back, R inner thigh Recommend regular full body skin exams Recommend daily broad spectrum sunscreen SPF 30+ to sun-exposed areas, reapply every 2 hours as needed.  Call if any new or changing lesions are noted between office visits    INFLAMED SEBORRHEIC KERATOSIS L  forearm near wrist, R lat lower leg Exam: pink tan waxy macules  Treatment Plan: Benign, Start otc HC cream until inflammation resolved, discussed LN2 if irritating  Varicose Veins/Spider Veins Lower legs - Dilated blue, purple or red veins at the lower extremities -  Reassured - Smaller vessels can be treated by sclerotherapy (a procedure to inject a medicine into the veins to make them disappear) if desired, but the treatment is not covered by insurance. Larger vessels may be covered if symptomatic and we would refer to vascular surgeon if treatment desired.  Return in about 1 year (around 12/12/2024) for TBSE, Hx of BCC, Hx of SCC IS, Hx of Dysplastic nevi.  I, Ardis Rowan, RMA, am acting as scribe for Willeen Niece, MD .   Documentation: I have reviewed the above documentation for accuracy and completeness, and I agree with the above.  Willeen Niece, MD

## 2024-12-11 ENCOUNTER — Ambulatory Visit: Admitting: Dermatology
